# Patient Record
Sex: Male | Born: 1950
Health system: Southern US, Community
[De-identification: ages and names within clinical notes are randomized; demographics above are authoritative.]

## PROBLEM LIST (undated history)

## (undated) DIAGNOSIS — W3400XA Accidental discharge from unspecified firearms or gun, initial encounter: Secondary | ICD-10-CM

## (undated) DIAGNOSIS — J45909 Unspecified asthma, uncomplicated: Secondary | ICD-10-CM

## (undated) DIAGNOSIS — B1920 Unspecified viral hepatitis C without hepatic coma: Secondary | ICD-10-CM

## (undated) DIAGNOSIS — J189 Pneumonia, unspecified organism: Secondary | ICD-10-CM

## (undated) HISTORY — PX: APPENDECTOMY: SHX54

## (undated) HISTORY — PX: OTHER SURGICAL HISTORY: SHX169

## (undated) HISTORY — PX: CHOLECYSTECTOMY: SHX55

---

## 1998-03-20 ENCOUNTER — Emergency Department (HOSPITAL_COMMUNITY): Admission: EM | Admit: 1998-03-20 | Discharge: 1998-03-20 | Payer: Self-pay | Admitting: Internal Medicine

## 1998-03-20 ENCOUNTER — Encounter: Payer: Self-pay | Admitting: Internal Medicine

## 1999-10-20 ENCOUNTER — Encounter: Payer: Self-pay | Admitting: Emergency Medicine

## 1999-10-20 ENCOUNTER — Inpatient Hospital Stay: Admission: EM | Admit: 1999-10-20 | Discharge: 1999-10-26 | Payer: Self-pay | Admitting: Emergency Medicine

## 1999-10-22 ENCOUNTER — Encounter: Payer: Self-pay | Admitting: Internal Medicine

## 2001-02-13 ENCOUNTER — Encounter: Payer: Self-pay | Admitting: Internal Medicine

## 2001-02-13 ENCOUNTER — Ambulatory Visit (HOSPITAL_COMMUNITY): Admission: RE | Admit: 2001-02-13 | Discharge: 2001-02-13 | Payer: Self-pay | Admitting: Internal Medicine

## 2001-03-05 ENCOUNTER — Ambulatory Visit (HOSPITAL_COMMUNITY): Admission: RE | Admit: 2001-03-05 | Discharge: 2001-03-05 | Payer: Self-pay | Admitting: *Deleted

## 2001-03-05 ENCOUNTER — Encounter: Payer: Self-pay | Admitting: *Deleted

## 2001-03-05 ENCOUNTER — Encounter (INDEPENDENT_AMBULATORY_CARE_PROVIDER_SITE_OTHER): Payer: Self-pay | Admitting: Specialist

## 2003-10-04 ENCOUNTER — Ambulatory Visit (HOSPITAL_COMMUNITY): Admission: RE | Admit: 2003-10-04 | Discharge: 2003-10-04 | Payer: Self-pay | Admitting: Gastroenterology

## 2003-11-17 ENCOUNTER — Ambulatory Visit (HOSPITAL_COMMUNITY): Admission: RE | Admit: 2003-11-17 | Discharge: 2003-11-17 | Payer: Self-pay | Admitting: Internal Medicine

## 2004-06-25 ENCOUNTER — Ambulatory Visit: Payer: Self-pay | Admitting: Internal Medicine

## 2005-07-09 ENCOUNTER — Ambulatory Visit (HOSPITAL_COMMUNITY): Admission: RE | Admit: 2005-07-09 | Discharge: 2005-07-09 | Payer: Self-pay | Admitting: Anesthesiology

## 2006-04-09 ENCOUNTER — Observation Stay (HOSPITAL_COMMUNITY): Admission: EM | Admit: 2006-04-09 | Discharge: 2006-04-10 | Payer: Self-pay | Admitting: Emergency Medicine

## 2006-10-16 ENCOUNTER — Ambulatory Visit: Payer: Self-pay | Admitting: Gastroenterology

## 2010-06-24 ENCOUNTER — Encounter: Payer: Self-pay | Admitting: Gastroenterology
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
## 2010-11-13 ENCOUNTER — Ambulatory Visit (HOSPITAL_BASED_OUTPATIENT_CLINIC_OR_DEPARTMENT_OTHER): Payer: 59 | Attending: Internal Medicine

## 2010-11-13 DIAGNOSIS — I4949 Other premature depolarization: Secondary | ICD-10-CM | POA: Insufficient documentation

## 2010-11-13 DIAGNOSIS — G4733 Obstructive sleep apnea (adult) (pediatric): Secondary | ICD-10-CM | POA: Insufficient documentation

## 2010-11-13 DIAGNOSIS — I491 Atrial premature depolarization: Secondary | ICD-10-CM | POA: Insufficient documentation

## 2010-11-17 DIAGNOSIS — G4710 Hypersomnia, unspecified: Secondary | ICD-10-CM

## 2010-11-17 DIAGNOSIS — G4730 Sleep apnea, unspecified: Secondary | ICD-10-CM

## 2010-11-17 NOTE — Procedures (Signed)
NAME:  Malik Fleming, Malik Fleming NO.:  [id number]  MEDICAL RECORD NO.:  [id number]          PATIENT TYPE:  OUT  LOCATION:  SLEEP CENTER                 FACILITY[facility]  PHYSICIAN:  Saleena Tamas D. Maple Hudson, MD, FCCP, FACPDATE OF BIRTH:  1950-12-28  DATE OF STUDY:  11/13/2010                           NOCTURNAL POLYSOMNOGRAM  REFERRING PHYSICIAN:  Barry Dienes. Eloise Harman, M.D.  INDICATIONS FOR STUDY:  Hypersomnia with sleep apnea.  EPWORTH SLEEPINESS SCORE:  18/24, BMI 20.1.  Weight 140 pounds, height 70 inches.  Neck 14 inches.  HOME MEDICATIONS:  Charted and reviewed.  SLEEP ARCHITECTURE:  Total sleep time 373.5 minutes with sleep efficiency 84.9%.  Stage I was 14.2%, stage II 62.8%, stage III absent, REM 13% of total sleep time.  Sleep latency 25 minutes, REM latency 63 minutes, awake after sleep onset 41 minutes, arousal index 56.5.  BEDTIME MEDICATION:  None.  RESPIRATORY DATA:  Apnea/hypopnea index (AHI) 7.2 per hour.  A total of 45 events was scored including 17 obstructive apneas, 6 central apneas, 22 hypopneas.  Events were associated with supine sleep position.  REM AHI 21.1 per hour.  This was a diagnostic and PSG protocol as requested. CPAP titration was not performed.  OXYGEN DATA:  Mild-to-moderately loud snoring with oxygen desaturation to a nadir of 84% and a mean oxygen saturation through the study of 90.4% on room air.  A total of 3.7 minutes was recorded with oxygen saturation less than 88% on room air.  CARDIAC DATA:  Sinus rhythm with occasional PAC and PVC.  MOVEMENT/PARASOMNIA:  No significant movement disturbance.  Bathroom x1.  IMPRESSION/RECOMMENDATIONS: 1. Mild obstructive sleep apnea/hypopnea syndrome, AHI 7.2 per hour.     Mild to moderately loud snoring with oxygen desaturation to a nadir     of 84% and a mean oxygen saturation through the study of 90.4% on     room air. The Respiratory Disturbance Index (RDI) of 21.1/ hr     refects relatively  high number of additional events, too short or     mild to meet standard defining criteria as apnea, but suggesting     additional tendency for airway relaxation to disturb sleep. 2. The study was done as a diagnostic and PSG protocol as requested.     Scores in this range may be well treated     with CPAP if symptoms justify and simpler measures are     insufficient. .  More conservative measures including consideration     of a chin strap or encouragement to sleep off flat or back, may be     considered.     Giulietta Prokop D. Maple Hudson, MD, [facility], FACP Diplomate, Biomedical engineer of Sleep Medicine Electronically Signed    CDY/MEDQ  D:  11/17/2010 08:12:44  T:  11/17/2010 09:51:08  Job:  [id number]

## 2010-11-27 ENCOUNTER — Encounter: Payer: Self-pay | Admitting: *Deleted

## 2010-11-27 ENCOUNTER — Ambulatory Visit (INDEPENDENT_AMBULATORY_CARE_PROVIDER_SITE_OTHER): Payer: 59 | Admitting: Internal Medicine

## 2010-11-27 ENCOUNTER — Encounter: Payer: Self-pay | Admitting: Internal Medicine

## 2010-11-27 VITALS — BP 110/68 | HR 65 | Ht 70.0 in | Wt 148.0 lb

## 2010-11-27 DIAGNOSIS — G4733 Obstructive sleep apnea (adult) (pediatric): Secondary | ICD-10-CM

## 2010-11-27 DIAGNOSIS — Z720 Tobacco use: Secondary | ICD-10-CM

## 2010-11-27 DIAGNOSIS — F17200 Nicotine dependence, unspecified, uncomplicated: Secondary | ICD-10-CM
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
## 2010-11-27 NOTE — Patient Instructions (Addendum)
It is your responsibility to be an alert, safe driver.  Consider appropriate use of caffeine- drink or tablet, occasionally if needed.  Naps can be good, just not while driving.  Some like chin straps for snoring- available at drug store.  Sample Nasonex - 1-2 sprays each nostril, every night at bedtime. This may make breathing through your nose easier  Booklet on sleep apnea for your information. Ask your wife what she notices about your breathing while you are asleep.   Letter for employer: Was seen today for necessary medical care.  May return to unrestricted work and driving.
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
## 2010-11-29 ENCOUNTER — Telehealth: Payer: Self-pay | Admitting: Internal Medicine

## 2010-11-29 NOTE — Telephone Encounter (Signed)
Spoke with patient-states he needs OV notes faxed asap to Dr Jarold Motto at [facility] as his job is at risk. I called and spoke with Lupita Leash at Dr. Norval Gable office and she is aware of the work note and OV notes we have here-faxed incomplete note-Donna stated she would just write a note for pt's job on a Rx pad and fax to his job.    Pt is aware of the above-he knows to contact Dr. Norval Gable office with any questions regarding this matter.

## 2010-12-02 ENCOUNTER — Encounter: Payer: Self-pay | Admitting: Internal Medicine

## 2010-12-02 DIAGNOSIS — Z720 Tobacco use: Secondary | ICD-10-CM | POA: Insufficient documentation

## 2010-12-14 ENCOUNTER — Encounter: Payer: Self-pay | Admitting: Internal Medicine

## 2015-08-31 ENCOUNTER — Encounter (HOSPITAL_COMMUNITY): Payer: Self-pay | Admitting: Emergency Medicine

## 2015-08-31 ENCOUNTER — Emergency Department (HOSPITAL_COMMUNITY): Payer: 59

## 2015-08-31 ENCOUNTER — Emergency Department (HOSPITAL_COMMUNITY)
Admission: EM | Admit: 2015-08-31 | Discharge: 2015-09-01 | Disposition: A | Discharge status: Home / Self Care | Payer: 59 | Attending: Emergency Medicine | Admitting: Emergency Medicine

## 2015-08-31 DIAGNOSIS — Z8619 Personal history of other infectious and parasitic diseases: Secondary | ICD-10-CM | POA: Diagnosis not present

## 2015-08-31 DIAGNOSIS — F17210 Nicotine dependence, cigarettes, uncomplicated: Secondary | ICD-10-CM | POA: Diagnosis not present

## 2015-08-31 DIAGNOSIS — Z9049 Acquired absence of other specified parts of digestive tract: Secondary | ICD-10-CM | POA: Insufficient documentation

## 2015-08-31 DIAGNOSIS — R079 Chest pain, unspecified: Secondary | ICD-10-CM | POA: Diagnosis not present

## 2015-08-31 DIAGNOSIS — Z79899 Other long term (current) drug therapy: Secondary | ICD-10-CM | POA: Insufficient documentation

## 2015-08-31 DIAGNOSIS — J441 Chronic obstructive pulmonary disease with (acute) exacerbation: Secondary | ICD-10-CM | POA: Insufficient documentation

## 2015-08-31 DIAGNOSIS — R0989 Other specified symptoms and signs involving the circulatory and respiratory systems: Secondary | ICD-10-CM | POA: Insufficient documentation

## 2015-08-31 DIAGNOSIS — K5732 Diverticulitis of large intestine without perforation or abscess without bleeding: Secondary | ICD-10-CM | POA: Diagnosis not present

## 2015-08-31 DIAGNOSIS — R109 Unspecified abdominal pain: Secondary | ICD-10-CM | POA: Diagnosis present

## 2015-08-31 DIAGNOSIS — Z87828 Personal history of other (healed) physical injury and trauma: Secondary | ICD-10-CM | POA: Insufficient documentation

## 2015-08-31 DIAGNOSIS — Z79891 Long term (current) use of opiate analgesic: Secondary | ICD-10-CM | POA: Insufficient documentation

## 2015-08-31 HISTORY — DX: Accidental discharge from unspecified firearms or gun, initial encounter: W34.00XA

## 2015-08-31 HISTORY — DX: Unspecified viral hepatitis C without hepatic coma: B19.20

## 2015-08-31 HISTORY — DX: Unspecified asthma, uncomplicated: J45.909

## 2015-08-31 LAB — CBC
HCT: 40.7 % (ref 39.0–52.0)
Hemoglobin: 14.1 g/dL (ref 13.0–17.0)
MCH: 36.1 pg — AB (ref 26.0–34.0)
MCHC: 34.6 g/dL (ref 30.0–36.0)
MCV: 104.1 fL — AB (ref 78.0–100.0)
PLATELETS: 208 10*3/uL (ref 150–400)
RBC: 3.91 MIL/uL — AB (ref 4.22–5.81)
RDW: 12.7 % (ref 11.5–15.5)
WBC: 12.3 10*3/uL — AB (ref 4.0–10.5)

## 2015-08-31 LAB — HEPATIC FUNCTION PANEL
ALBUMIN: 4.1 g/dL (ref 3.5–5.0)
ALK PHOS: 66 U/L (ref 38–126)
ALT: 28 U/L (ref 17–63)
AST: 28 U/L (ref 15–41)
BILIRUBIN DIRECT: 0.2 mg/dL (ref 0.1–0.5)
BILIRUBIN TOTAL: 0.6 mg/dL (ref 0.3–1.2)
Indirect Bilirubin: 0.4 mg/dL (ref 0.3–0.9)
Total Protein: 8.1 g/dL (ref 6.5–8.1)

## 2015-08-31 LAB — BASIC METABOLIC PANEL
Anion gap: 10 (ref 5–15)
BUN: 10 mg/dL (ref 6–20)
CALCIUM: 9.3 mg/dL (ref 8.9–10.3)
CO2: 25 mmol/L (ref 22–32)
CREATININE: 0.89 mg/dL (ref 0.61–1.24)
Chloride: 102 mmol/L (ref 101–111)
Glucose, Bld: 119 mg/dL — ABNORMAL HIGH (ref 65–99)
Potassium: 3.6 mmol/L (ref 3.5–5.1)
SODIUM: 137 mmol/L (ref 135–145)

## 2015-08-31 LAB — LIPASE, BLOOD: LIPASE: 56 U/L — AB (ref 11–51)

## 2015-08-31 LAB — I-STAT TROPONIN, ED: TROPONIN I, POC: 0 ng/mL (ref 0.00–0.08)

## 2015-08-31 MED ORDER — ONDANSETRON 4 MG PO TBDP
ORAL_TABLET | ORAL | Status: AC
  Filled 2015-08-31: qty 2

## 2015-08-31 MED ORDER — IPRATROPIUM-ALBUTEROL 0.5-2.5 (3) MG/3ML IN SOLN
3.0000 mL | Freq: Once | RESPIRATORY_TRACT | Status: AC
  Administered 2015-08-31: 3 mL via RESPIRATORY_TRACT
  Filled 2015-08-31: qty 3

## 2015-08-31 MED ORDER — ONDANSETRON 4 MG PO TBDP
8.0000 mg | ORAL_TABLET | Freq: Once | ORAL | Status: AC
  Administered 2015-08-31: 8 mg via ORAL

## 2015-08-31 MED ORDER — MORPHINE SULFATE (PF) 4 MG/ML IV SOLN
4.0000 mg | Freq: Once | INTRAVENOUS | Status: AC
  Administered 2015-09-01: 4 mg via INTRAVENOUS
  Filled 2015-08-31: qty 1

## 2015-08-31 MED ORDER — SODIUM CHLORIDE 0.9 % IV BOLUS (SEPSIS)
500.0000 mL | Freq: Once | INTRAVENOUS | Status: AC
  Administered 2015-09-01: 500 mL via INTRAVENOUS

## 2015-08-31 MED ORDER — ONDANSETRON HCL 4 MG/2ML IJ SOLN
4.0000 mg | Freq: Once | INTRAMUSCULAR | Status: AC
  Administered 2015-09-01: 4 mg via INTRAVENOUS
  Filled 2015-08-31: qty 2
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
## 2015-08-31 NOTE — ED Notes (Signed)
Dr. Pollina at bedside at this time. 

## 2015-08-31 NOTE — ED Notes (Signed)
Pt. reports intermittent central chest pain with SOB , dry cough  and emesis onset this evening .

## 2015-09-01 ENCOUNTER — Emergency Department (HOSPITAL_COMMUNITY): Payer: 59

## 2015-09-01 ENCOUNTER — Encounter (HOSPITAL_COMMUNITY): Payer: Self-pay | Admitting: Radiology

## 2015-09-01 LAB — BRAIN NATRIURETIC PEPTIDE: B Natriuretic Peptide: 21.8 pg/mL (ref 0.0–100.0)

## 2015-09-01 MED ORDER — IOPAMIDOL (ISOVUE-300) INJECTION 61%
INTRAVENOUS | Status: AC
  Filled 2015-09-01: qty 100

## 2015-09-01 MED ORDER — IOHEXOL 350 MG/ML SOLN
100.0000 mL | Freq: Once | INTRAVENOUS | Status: AC | PRN
  Administered 2015-09-01: 100 mL via INTRAVENOUS

## 2015-09-01 MED ORDER — HYDROMORPHONE HCL 1 MG/ML IJ SOLN
1.0000 mg | Freq: Once | INTRAMUSCULAR | Status: AC
  Administered 2015-09-01: 1 mg via INTRAVENOUS
  Filled 2015-09-01: qty 1

## 2015-09-01 MED ORDER — METRONIDAZOLE 500 MG PO TABS: 500.0000 mg | ORAL_TABLET | Freq: Three times a day (TID) | ORAL | Status: DC

## 2015-09-01 MED ORDER — LEVOFLOXACIN 500 MG PO TABS: 500.0000 mg | ORAL_TABLET | Freq: Every day | ORAL | Status: DC

## 2015-09-01 NOTE — ED Notes (Signed)
Patient transported to CT 

## 2015-09-01 NOTE — Discharge Instructions (Signed)
Diverticulitis  Diverticulitis is when small pockets that have formed in your colon (large intestine) become infected or swollen.  HOME CARE  · Follow your doctor's instructions.  · Follow a special diet if told by your doctor.  · When you feel better, your doctor may tell you to change your diet. You may be told to eat a lot of fiber. Fruits and vegetables are good sources of fiber. Fiber makes it easier to poop (have bowel movements).  · Take supplements or probiotics as told by your doctor.  · Only take medicines as told by your doctor.  · Keep all follow-up visits with your doctor.  GET HELP IF:  · Your pain does not get better.  · You have a hard time eating food.  · You are not pooping like normal.  GET HELP RIGHT AWAY IF:  · Your pain gets worse.  · Your problems do not get better.  · Your problems suddenly get worse.  · You have a fever.  · You keep throwing up (vomiting).  · You have bloody or black, tarry poop (stool).  MAKE SURE YOU:   · Understand these instructions.  · Will watch your condition.  · Will get help right away if you are not doing well or get worse.     This information is not intended to replace advice given to you by your health care provider. Make sure you discuss any questions you have with your health care provider.     Document Released: 11/06/2007 Document Revised: 05/25/2013 Document Reviewed: 04/14/2013  Elsevier Interactive Patient Education ©2016 [employer]

## 2017-02-04 ENCOUNTER — Emergency Department (HOSPITAL_COMMUNITY)
Admission: EM | Admit: 2017-02-04 | Discharge: 2017-02-05 | Disposition: A | Discharge status: Home / Self Care | Payer: 59 | Attending: Emergency Medicine | Admitting: Emergency Medicine

## 2017-02-04 ENCOUNTER — Emergency Department (HOSPITAL_COMMUNITY): Payer: 59

## 2017-02-04 ENCOUNTER — Encounter (HOSPITAL_COMMUNITY): Payer: Self-pay

## 2017-02-04 DIAGNOSIS — R112 Nausea with vomiting, unspecified: Secondary | ICD-10-CM | POA: Insufficient documentation

## 2017-02-04 DIAGNOSIS — R079 Chest pain, unspecified: Secondary | ICD-10-CM | POA: Diagnosis not present

## 2017-02-04 DIAGNOSIS — R0602 Shortness of breath: Secondary | ICD-10-CM | POA: Insufficient documentation

## 2017-02-04 DIAGNOSIS — R0789 Other chest pain: Secondary | ICD-10-CM | POA: Diagnosis not present

## 2017-02-04 DIAGNOSIS — K4091 Unilateral inguinal hernia, without obstruction or gangrene, recurrent: Secondary | ICD-10-CM | POA: Diagnosis not present

## 2017-02-04 DIAGNOSIS — F17210 Nicotine dependence, cigarettes, uncomplicated: Secondary | ICD-10-CM | POA: Insufficient documentation

## 2017-02-04 DIAGNOSIS — R1030 Lower abdominal pain, unspecified: Secondary | ICD-10-CM | POA: Insufficient documentation

## 2017-02-04 DIAGNOSIS — R1110 Vomiting, unspecified: Secondary | ICD-10-CM | POA: Diagnosis present

## 2017-02-04 DIAGNOSIS — Z79899 Other long term (current) drug therapy: Secondary | ICD-10-CM | POA: Diagnosis not present

## 2017-02-04 DIAGNOSIS — J45909 Unspecified asthma, uncomplicated: Secondary | ICD-10-CM | POA: Diagnosis not present

## 2017-02-04 LAB — COMPREHENSIVE METABOLIC PANEL
ALBUMIN: 4.2 g/dL (ref 3.5–5.0)
ALT: 24 U/L (ref 17–63)
AST: 31 U/L (ref 15–41)
Alkaline Phosphatase: 77 U/L (ref 38–126)
Anion gap: 9 (ref 5–15)
BUN: 10 mg/dL (ref 6–20)
CHLORIDE: 101 mmol/L (ref 101–111)
CO2: 26 mmol/L (ref 22–32)
Calcium: 9.1 mg/dL (ref 8.9–10.3)
Creatinine, Ser: 0.69 mg/dL (ref 0.61–1.24)
GFR calc Af Amer: >60 mL/min (ref >60)
GFR calc non Af Amer: >60 mL/min (ref >60)
GLUCOSE: 143 mg/dL — AB (ref 65–99)
POTASSIUM: 3.9 mmol/L (ref 3.5–5.1)
Sodium: 136 mmol/L (ref 135–145)
Total Bilirubin: 0.7 mg/dL (ref 0.3–1.2)
Total Protein: 7.7 g/dL (ref 6.5–8.1)

## 2017-02-04 LAB — CBC
HCT: 43.7 % (ref 39.0–52.0)
Hemoglobin: 15.2 g/dL (ref 13.0–17.0)
MCH: 35.8 pg — AB (ref 26.0–34.0)
MCHC: 34.8 g/dL (ref 30.0–36.0)
MCV: 103.1 fL — ABNORMAL HIGH (ref 78.0–100.0)
Platelets: UNDETERMINED 10*3/uL (ref 150–400)
RBC: 4.24 MIL/uL (ref 4.22–5.81)
RDW: 13.1 % (ref 11.5–15.5)
WBC: 9.4 10*3/uL (ref 4.0–10.5)

## 2017-02-04 LAB — I-STAT CG4 LACTIC ACID, ED: LACTIC ACID, VENOUS: 1.45 mmol/L (ref 0.5–1.9)

## 2017-02-04 LAB — I-STAT TROPONIN, ED
TROPONIN I, POC: 0 ng/mL (ref 0.00–0.08)
Troponin i, poc: 0 ng/mL (ref 0.00–0.08)

## 2017-02-04 MED ORDER — FENTANYL CITRATE (PF) 100 MCG/2ML IJ SOLN
50.0000 ug | Freq: Once | INTRAMUSCULAR | Status: AC
  Administered 2017-02-04: 50 ug via INTRAVENOUS
  Filled 2017-02-04: qty 2

## 2017-02-04 MED ORDER — ONDANSETRON HCL 4 MG PO TABS: 4.0000 mg | ORAL_TABLET | Freq: Four times a day (QID) | ORAL | 0 refills | Status: DC

## 2017-02-04 MED ORDER — SODIUM CHLORIDE 0.9 % IV BOLUS (SEPSIS)
1000.0000 mL | Freq: Once | INTRAVENOUS | Status: AC
  Administered 2017-02-04: 1000 mL via INTRAVENOUS

## 2017-02-04 MED ORDER — IOPAMIDOL (ISOVUE-300) INJECTION 61%
INTRAVENOUS | Status: AC
  Administered 2017-02-04: 100 mL via INTRAVENOUS
  Filled 2017-02-04: qty 100

## 2017-02-04 MED ORDER — MORPHINE SULFATE (PF) 4 MG/ML IV SOLN
4.0000 mg | Freq: Once | INTRAVENOUS | Status: AC
  Administered 2017-02-04: 4 mg via INTRAVENOUS
  Filled 2017-02-04: qty 1

## 2017-02-04 MED ORDER — IPRATROPIUM-ALBUTEROL 0.5-2.5 (3) MG/3ML IN SOLN
3.0000 mL | Freq: Once | RESPIRATORY_TRACT | Status: AC
  Administered 2017-02-04: 3 mL via RESPIRATORY_TRACT
  Filled 2017-02-04: qty 3

## 2017-02-04 MED ORDER — IOPAMIDOL (ISOVUE-300) INJECTION 61%: 100.0000 mL | Freq: Once | INTRAVENOUS | Status: DC | PRN

## 2017-02-04 MED ORDER — ONDANSETRON HCL 4 MG/2ML IJ SOLN
4.0000 mg | Freq: Once | INTRAMUSCULAR | Status: AC
  Administered 2017-02-04: 4 mg via INTRAVENOUS
  Filled 2017-02-04: qty 2

## 2017-02-04 MED ORDER — OXYCODONE HCL 5 MG PO TABS: 5.0000 mg | ORAL_TABLET | Freq: Once | ORAL | Status: DC

## 2017-02-04 MED ORDER — FENTANYL CITRATE (PF) 100 MCG/2ML IJ SOLN
50.0000 ug | Freq: Once | INTRAMUSCULAR | Status: AC
  Administered 2017-02-04: 50 ug via INTRAVENOUS

## 2017-02-04 NOTE — ED Notes (Signed)
Patient states he just feels miserable

## 2017-02-04 NOTE — ED Notes (Signed)
Dr. Tyrone Nine at bedside-patient placed in modified trendelenburg to facilitate right lower quad hernia to reduce-patient complaining 10/10 right lower quad/groin pain. Medicated with Fentanyl 100 mcg IV per VO Dr. Tyrone Nine. Will continue to monitor

## 2017-02-04 NOTE — Discharge Instructions (Signed)
Follow-up with her primary care doctor next 24-48 hours for further evaluation.  Take the Zofran as directed for nausea/vomiting.  Patient taking ibuprofen or irregular pain medications for pain.  Make sure you're just drinking plenty of fluids and staying hydrated.  Return the emergency Department for any fever, worsening chest pain, abdominal pain, difficulty breathing, redness/swelling of the hernia or any other worsening or concerning symptoms.
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
## 2017-02-04 NOTE — ED Notes (Signed)
Transported to CT 

## 2017-02-04 NOTE — ED Notes (Signed)
Patient states he has COPD and needs a breathing treatment-RT paged

## 2017-02-04 NOTE — ED Triage Notes (Signed)
Patient c/o mid chest pain since 1230 today. Patient states increased SOB and has COPD. Patient also c/o vomiting since early this AM. Patient also has a right hernia with increased pain and swelling. Patient denies any testicular swelling.

## 2017-02-04 NOTE — ED Notes (Signed)
Re-evaluated by PA-patient reports no change in chest pain or right lower groin pain

## 2017-02-04 NOTE — ED Notes (Signed)
Patient wants to stay in clothes instead of changing into a gown.

## 2017-02-05 NOTE — ED Provider Notes (Signed)
Hernia reduction Date/Time: 02/05/2017 12:36 PM Performed by: Tyrone Nine Tresia Revolorio Authorized by: Deno Etienne  Consent: Verbal consent obtained. Consent given by: patient Patient understanding: patient states understanding of the procedure being performed Patient consent: the patient's understanding of the procedure matches consent given Time out: Immediately prior to procedure a "time out" was called to verify the correct patient, procedure, equipment, support staff and site/side marked as required. Local anesthesia used: no  Anesthesia: Local anesthesia used: no  Sedation: Patient sedated: no Patient tolerance: Patient tolerated the procedure well with no immediate complications    Medical screening examination/treatment/procedure(s) were conducted as a shared visit with non-physician practitioner(s) and myself.  I personally evaluated the patient during the encounter.   EKG Interpretation  Date/Time:  Tuesday February 04 2017 16:44:16 EDT Ventricular Rate:  65 PR Interval:    QRS Duration: 96 QT Interval:  427 QTC Calculation: 444 R Axis:   79 Text Interpretation:  Sinus rhythm Borderline short PR interval No significant change since last tracing Confirmed by Deno Etienne [phone]) on 02/04/2017 6:30:17 PM Also confirmed by Deno Etienne [phone]), editor Drema Pry [phone])  on 02/05/2017 7:34:25 AM        See the written copy of this report in the patient's paper medical record.  These results did not interface directly into the electronic medical record and are summarized here.  66 yo M with nausea and vomiting.  Afterwhich started having abdominal pain at the location of a direct inguinal hernia.  Reduced at bedside, no noted erythema, minimal difficulty.  Clear lungs, no other noted  Abdominal pain.  Persistent pain post reduction so CT obtained.  Negative for acute pathology.  Improved clinically.  Given surgical follow up.    Deno Etienne, DO 02/05/17 1238

## 2017-04-08 ENCOUNTER — Emergency Department (HOSPITAL_COMMUNITY): Payer: 59

## 2017-04-08 ENCOUNTER — Encounter (HOSPITAL_COMMUNITY): Payer: Self-pay | Admitting: Emergency Medicine

## 2017-04-08 ENCOUNTER — Observation Stay (HOSPITAL_COMMUNITY)
Admission: EM | Admit: 2017-04-08 | Discharge: 2017-04-15 | DRG: 392 | Disposition: A | Discharge status: Home / Self Care | Payer: 59 | Attending: Family Medicine | Admitting: Family Medicine

## 2017-04-08 DIAGNOSIS — R402412 Glasgow coma scale score 13-15, at arrival to emergency department: Secondary | ICD-10-CM | POA: Diagnosis not present

## 2017-04-08 DIAGNOSIS — F17210 Nicotine dependence, cigarettes, uncomplicated: Secondary | ICD-10-CM | POA: Diagnosis not present

## 2017-04-08 DIAGNOSIS — Z7951 Long term (current) use of inhaled steroids: Secondary | ICD-10-CM | POA: Diagnosis not present

## 2017-04-08 DIAGNOSIS — R079 Chest pain, unspecified: Secondary | ICD-10-CM | POA: Diagnosis present

## 2017-04-08 DIAGNOSIS — Z880 Allergy status to penicillin: Secondary | ICD-10-CM

## 2017-04-08 DIAGNOSIS — K4090 Unilateral inguinal hernia, without obstruction or gangrene, not specified as recurrent: Secondary | ICD-10-CM

## 2017-04-08 DIAGNOSIS — K5732 Diverticulitis of large intestine without perforation or abscess without bleeding: Secondary | ICD-10-CM | POA: Diagnosis not present

## 2017-04-08 DIAGNOSIS — J449 Chronic obstructive pulmonary disease, unspecified: Secondary | ICD-10-CM | POA: Diagnosis not present

## 2017-04-08 DIAGNOSIS — Z79899 Other long term (current) drug therapy: Secondary | ICD-10-CM

## 2017-04-08 DIAGNOSIS — Z886 Allergy status to analgesic agent status: Secondary | ICD-10-CM

## 2017-04-08 DIAGNOSIS — K5792 Diverticulitis of intestine, part unspecified, without perforation or abscess without bleeding: Secondary | ICD-10-CM | POA: Diagnosis present

## 2017-04-08 DIAGNOSIS — E876 Hypokalemia: Secondary | ICD-10-CM | POA: Diagnosis not present

## 2017-04-08 DIAGNOSIS — Z720 Tobacco use: Secondary | ICD-10-CM | POA: Diagnosis present

## 2017-04-08 DIAGNOSIS — Z765 Malingerer [conscious simulation]: Secondary | ICD-10-CM

## 2017-04-08 DIAGNOSIS — R112 Nausea with vomiting, unspecified: Secondary | ICD-10-CM | POA: Diagnosis present

## 2017-04-08 LAB — CBC
HCT: 43.6 % (ref 39.0–52.0)
Hemoglobin: 15.3 g/dL (ref 13.0–17.0)
MCH: 35.4 pg — ABNORMAL HIGH (ref 26.0–34.0)
MCHC: 35.1 g/dL (ref 30.0–36.0)
MCV: 100.9 fL — ABNORMAL HIGH (ref 78.0–100.0)
Platelets: 209 10*3/uL (ref 150–400)
RBC: 4.32 MIL/uL (ref 4.22–5.81)
RDW: 13.1 % (ref 11.5–15.5)
WBC: 8.6 10*3/uL (ref 4.0–10.5)

## 2017-04-08 LAB — BASIC METABOLIC PANEL
Anion gap: 14 (ref 5–15)
BUN: <5 mg/dL — ABNORMAL LOW (ref 6–20)
CO2: 22 mmol/L (ref 22–32)
Calcium: 8.8 mg/dL — ABNORMAL LOW (ref 8.9–10.3)
Chloride: 102 mmol/L (ref 101–111)
Creatinine, Ser: 0.67 mg/dL (ref 0.61–1.24)
GFR calc Af Amer: >60 mL/min (ref >60)
GFR calc non Af Amer: >60 mL/min (ref >60)
Glucose, Bld: 130 mg/dL — ABNORMAL HIGH (ref 65–99)
Potassium: 3 mmol/L — ABNORMAL LOW (ref 3.5–5.1)
Sodium: 138 mmol/L (ref 135–145)

## 2017-04-08 LAB — I-STAT TROPONIN, ED: TROPONIN I, POC: 0 ng/mL (ref 0.00–0.08)

## 2017-04-08 LAB — HEPATIC FUNCTION PANEL
ALT: 23 U/L (ref 17–63)
AST: 33 U/L (ref 15–41)
Albumin: 3.9 g/dL (ref 3.5–5.0)
Alkaline Phosphatase: 79 U/L (ref 38–126)
BILIRUBIN DIRECT: 0.3 mg/dL (ref 0.1–0.5)
BILIRUBIN TOTAL: 0.9 mg/dL (ref 0.3–1.2)
Indirect Bilirubin: 0.6 mg/dL (ref 0.3–0.9)
Total Protein: 7.2 g/dL (ref 6.5–8.1)

## 2017-04-08 LAB — LIPASE, BLOOD: Lipase: 28 U/L (ref 11–51)

## 2017-04-08 MED ORDER — MORPHINE SULFATE (PF) 4 MG/ML IV SOLN
4.0000 mg | Freq: Once | INTRAVENOUS | Status: AC
  Administered 2017-04-08: 4 mg via INTRAVENOUS
  Filled 2017-04-08: qty 1

## 2017-04-08 MED ORDER — HYDROMORPHONE HCL 1 MG/ML IJ SOLN
1.0000 mg | Freq: Once | INTRAMUSCULAR | Status: AC
  Administered 2017-04-09: 1 mg via INTRAVENOUS
  Filled 2017-04-08: qty 1

## 2017-04-08 MED ORDER — METRONIDAZOLE IN NACL 5-0.79 MG/ML-% IV SOLN
500.0000 mg | Freq: Once | INTRAVENOUS | Status: AC
  Administered 2017-04-09: 500 mg via INTRAVENOUS
  Filled 2017-04-08: qty 100

## 2017-04-08 MED ORDER — IOPAMIDOL (ISOVUE-300) INJECTION 61%
INTRAVENOUS | Status: AC
  Administered 2017-04-08: 100 mL
  Filled 2017-04-08: qty 100

## 2017-04-08 MED ORDER — METOCLOPRAMIDE HCL 5 MG/ML IJ SOLN
10.0000 mg | Freq: Once | INTRAMUSCULAR | Status: AC
  Administered 2017-04-08: 10 mg via INTRAVENOUS
  Filled 2017-04-08: qty 2

## 2017-04-08 MED ORDER — CIPROFLOXACIN IN D5W 400 MG/200ML IV SOLN
400.0000 mg | Freq: Once | INTRAVENOUS | Status: AC
  Administered 2017-04-09: 400 mg via INTRAVENOUS
  Filled 2017-04-08: qty 200

## 2017-04-08 MED ORDER — POTASSIUM CHLORIDE 10 MEQ/100ML IV SOLN
10.0000 meq | Freq: Once | INTRAVENOUS | Status: AC
  Administered 2017-04-08: 10 meq via INTRAVENOUS
  Filled 2017-04-08: qty 100

## 2017-04-08 MED ORDER — SODIUM CHLORIDE 0.9 % IV BOLUS (SEPSIS)
1000.0000 mL | Freq: Once | INTRAVENOUS | Status: AC
  Administered 2017-04-08: 1000 mL via INTRAVENOUS

## 2017-04-08 NOTE — ED Notes (Signed)
Pt refuses to put gown on or change clothes.

## 2017-04-08 NOTE — ED Notes (Signed)
Patient transported to X-ray 

## 2017-04-08 NOTE — ED Notes (Signed)
Pt c/o burning at IV site and up forearm. Fluid bolus hung to dilute potassium. Pt also requesting water, which was provided, and "something to help me sleep."

## 2017-04-08 NOTE — ED Notes (Signed)
ED Provider at bedside. 
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
## 2017-04-08 NOTE — ED Notes (Signed)
Pt up to restroom. Upon return, pt will not keep b/p cuff on.  Does not want to be hooked up to "that stuff". Explained it was important to monitor him.

## 2017-04-08 NOTE — ED Notes (Addendum)
Pt non-stop cursing in room towards this RN.

## 2017-04-08 NOTE — ED Notes (Signed)
Pt returned from xray

## 2017-04-08 NOTE — ED Notes (Signed)
Patient transported to CT 

## 2017-04-09 ENCOUNTER — Encounter (HOSPITAL_COMMUNITY): Payer: Self-pay | Admitting: Internal Medicine

## 2017-04-09 DIAGNOSIS — K5732 Diverticulitis of large intestine without perforation or abscess without bleeding: Secondary | ICD-10-CM | POA: Diagnosis not present

## 2017-04-09 DIAGNOSIS — R197 Diarrhea, unspecified: Secondary | ICD-10-CM

## 2017-04-09 DIAGNOSIS — J449 Chronic obstructive pulmonary disease, unspecified: Secondary | ICD-10-CM | POA: Diagnosis not present

## 2017-04-09 DIAGNOSIS — K5792 Diverticulitis of intestine, part unspecified, without perforation or abscess without bleeding: Secondary | ICD-10-CM | POA: Diagnosis present

## 2017-04-09 DIAGNOSIS — K4090 Unilateral inguinal hernia, without obstruction or gangrene, not specified as recurrent: Secondary | ICD-10-CM

## 2017-04-09 DIAGNOSIS — R112 Nausea with vomiting, unspecified: Secondary | ICD-10-CM

## 2017-04-09 DIAGNOSIS — Z720 Tobacco use: Secondary | ICD-10-CM | POA: Diagnosis not present

## 2017-04-09 DIAGNOSIS — E876 Hypokalemia: Secondary | ICD-10-CM | POA: Diagnosis present

## 2017-04-09 LAB — CBC
HCT: 38.3 % — ABNORMAL LOW (ref 39.0–52.0)
HEMOGLOBIN: 13.1 g/dL (ref 13.0–17.0)
MCH: 35.1 pg — AB (ref 26.0–34.0)
MCHC: 34.2 g/dL (ref 30.0–36.0)
MCV: 102.7 fL — ABNORMAL HIGH (ref 78.0–100.0)
Platelets: 185 10*3/uL (ref 150–400)
RBC: 3.73 MIL/uL — ABNORMAL LOW (ref 4.22–5.81)
RDW: 13.6 % (ref 11.5–15.5)
WBC: 8.1 10*3/uL (ref 4.0–10.5)

## 2017-04-09 LAB — BASIC METABOLIC PANEL
Anion gap: 9 (ref 5–15)
CALCIUM: 8 mg/dL — AB (ref 8.9–10.3)
CHLORIDE: 104 mmol/L (ref 101–111)
CO2: 23 mmol/L (ref 22–32)
CREATININE: 0.69 mg/dL (ref 0.61–1.24)
GFR calc non Af Amer: >60 mL/min (ref >60)
Glucose, Bld: 100 mg/dL — ABNORMAL HIGH (ref 65–99)
Potassium: 3.4 mmol/L — ABNORMAL LOW (ref 3.5–5.1)
SODIUM: 136 mmol/L (ref 135–145)

## 2017-04-09 LAB — TROPONIN I
Troponin I: <0.03 ng/mL (ref <0.03)
Troponin I: <0.03 ng/mL (ref <0.03)

## 2017-04-09 MED ORDER — ACETAMINOPHEN 325 MG PO TABS
650.0000 mg | ORAL_TABLET | Freq: Four times a day (QID) | ORAL | Status: DC | PRN
  Administered 2017-04-09: 650 mg via ORAL
  Filled 2017-04-09: qty 2

## 2017-04-09 MED ORDER — POTASSIUM CHLORIDE 10 MEQ/100ML IV SOLN
10.0000 meq | INTRAVENOUS | Status: AC
  Administered 2017-04-09 (×3): 10 meq via INTRAVENOUS
  Filled 2017-04-09 (×4): qty 100

## 2017-04-09 MED ORDER — LORAZEPAM 1 MG PO TABS
1.0000 mg | ORAL_TABLET | Freq: Every evening | ORAL | Status: DC | PRN
  Administered 2017-04-09: 1 mg via ORAL
  Filled 2017-04-09: qty 1

## 2017-04-09 MED ORDER — OXYCODONE HCL 5 MG PO TABS
5.0000 mg | ORAL_TABLET | Freq: Every day | ORAL | Status: DC
  Administered 2017-04-09: 5 mg via ORAL
  Filled 2017-04-09: qty 1

## 2017-04-09 MED ORDER — OXYCODONE HCL 5 MG PO TABS
10.0000 mg | ORAL_TABLET | ORAL | Status: DC
  Administered 2017-04-09 – 2017-04-12 (×4): 10 mg via ORAL
  Filled 2017-04-09 (×4): qty 2

## 2017-04-09 MED ORDER — OXYCODONE HCL 5 MG PO TABS
5.0000 mg | ORAL_TABLET | ORAL | Status: DC | PRN
  Administered 2017-04-09 – 2017-04-13 (×12): 5 mg via ORAL
  Filled 2017-04-09 (×12): qty 1

## 2017-04-09 MED ORDER — ACETAMINOPHEN 650 MG RE SUPP: 650.0000 mg | Freq: Four times a day (QID) | RECTAL | Status: DC | PRN

## 2017-04-09 MED ORDER — ALBUTEROL SULFATE (2.5 MG/3ML) 0.083% IN NEBU
2.5000 mg | INHALATION_SOLUTION | RESPIRATORY_TRACT | Status: DC | PRN
Start: 2017-04-09 — End: 2017-04-15

## 2017-04-09 MED ORDER — UMECLIDINIUM-VILANTEROL 62.5-25 MCG/INH IN AEPB
1.0000 | INHALATION_SPRAY | Freq: Every day | RESPIRATORY_TRACT | Status: DC
  Administered 2017-04-09 – 2017-04-15 (×6): 1 via RESPIRATORY_TRACT
  Filled 2017-04-09 (×2): qty 14

## 2017-04-09 MED ORDER — SODIUM CHLORIDE 0.9 % IV SOLN
INTRAVENOUS | Status: DC
  Administered 2017-04-09: 02:00:00 via INTRAVENOUS

## 2017-04-09 MED ORDER — HYDROMORPHONE HCL 1 MG/ML IJ SOLN: 1.0000 mg | INTRAMUSCULAR | Status: DC | PRN

## 2017-04-09 MED ORDER — MORPHINE SULFATE (PF) 4 MG/ML IV SOLN
4.0000 mg | Freq: Once | INTRAVENOUS | Status: AC
  Administered 2017-04-09: 4 mg via INTRAVENOUS
  Filled 2017-04-09: qty 1

## 2017-04-09 MED ORDER — METRONIDAZOLE IN NACL 5-0.79 MG/ML-% IV SOLN
500.0000 mg | Freq: Three times a day (TID) | INTRAVENOUS | Status: DC
  Administered 2017-04-09 – 2017-04-15 (×19): 500 mg via INTRAVENOUS
  Filled 2017-04-09 (×19): qty 100

## 2017-04-09 MED ORDER — ONDANSETRON HCL 4 MG PO TABS: 4.0000 mg | ORAL_TABLET | Freq: Four times a day (QID) | ORAL | Status: DC | PRN

## 2017-04-09 MED ORDER — POTASSIUM CHLORIDE IN NACL 20-0.9 MEQ/L-% IV SOLN
INTRAVENOUS | Status: DC
  Administered 2017-04-09 – 2017-04-11 (×4): via INTRAVENOUS
  Filled 2017-04-09 (×6): qty 1000

## 2017-04-09 MED ORDER — HYDROMORPHONE HCL 1 MG/ML IJ SOLN
0.5000 mg | INTRAMUSCULAR | Status: DC | PRN
  Administered 2017-04-09 (×2): 0.5 mg via INTRAVENOUS
  Filled 2017-04-09 (×2): qty 1

## 2017-04-09 MED ORDER — ONDANSETRON HCL 4 MG/2ML IJ SOLN
4.0000 mg | Freq: Four times a day (QID) | INTRAMUSCULAR | Status: DC | PRN
  Administered 2017-04-11 – 2017-04-13 (×2): 4 mg via INTRAVENOUS
  Filled 2017-04-09 (×2): qty 2

## 2017-04-09 MED ORDER — PANTOPRAZOLE SODIUM 40 MG IV SOLR
40.0000 mg | Freq: Two times a day (BID) | INTRAVENOUS | Status: DC
  Filled 2017-04-09 (×2): qty 40

## 2017-04-09 MED ORDER — ZOLPIDEM TARTRATE 5 MG PO TABS
5.0000 mg | ORAL_TABLET | Freq: Once | ORAL | Status: AC
  Administered 2017-04-09: 5 mg via ORAL
  Filled 2017-04-09: qty 1

## 2017-04-09 MED ORDER — ENOXAPARIN SODIUM 40 MG/0.4ML ~~LOC~~ SOLN
40.0000 mg | SUBCUTANEOUS | Status: DC
  Administered 2017-04-09 – 2017-04-14 (×6): 40 mg via SUBCUTANEOUS
  Filled 2017-04-09 (×7): qty 0.4

## 2017-04-09 MED ORDER — CIPROFLOXACIN IN D5W 400 MG/200ML IV SOLN
400.0000 mg | Freq: Two times a day (BID) | INTRAVENOUS | Status: DC
  Administered 2017-04-09 – 2017-04-15 (×13): 400 mg via INTRAVENOUS
  Filled 2017-04-09 (×13): qty 200

## 2017-04-09 MED ORDER — OXYCODONE HCL 5 MG PO TABS: 5.0000 mg | ORAL_TABLET | Freq: Every day | ORAL | Status: DC

## 2017-04-09 NOTE — Progress Notes (Signed)
Pt BP 180/85, confirmed with manual. HR 52, pt c/o abdominal pain 9/10 not controlled with tylenol and sweating. Temp 98.2, RR 18. MD notified, will continue to monitor.

## 2017-04-09 NOTE — Progress Notes (Signed)
Patient admitted after midnight, please see H&P.  Plan to f/up outpatient for known non-incarcerated hernia.  Will continue to treat presumed diverticulitis.  Once recovered, will need GI follow up for colonoscopy--- last one prob 10 years ago.  Eulogio Bear DO
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
## 2017-04-09 NOTE — Progress Notes (Signed)
Pt admitted from ED per stretcher accompanied by a nurse tech, on arrival to the floor pt was fully alert and oriented vital signs stable ID bracelet varified with pt, refused skin assessment and iv protonix per pt has not been able to sleep for two days and therefore asked me to paged on call Dr Hal Hope  for sleeping pill, gave me verbal order for ambiem 5mg , all other due meds are given hooked to tele and CCMD notified, low fall risk independent with no assistive device, will continue to monitor pt

## 2017-04-09 NOTE — ED Notes (Signed)
Pt refuses to keep b/p cuff on

## 2017-04-09 NOTE — ED Notes (Signed)
Patient denies pain and is resting comfortably.  
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
## 2017-04-10 DIAGNOSIS — Z720 Tobacco use: Secondary | ICD-10-CM | POA: Diagnosis not present

## 2017-04-10 DIAGNOSIS — E876 Hypokalemia: Secondary | ICD-10-CM | POA: Diagnosis not present

## 2017-04-10 DIAGNOSIS — K4090 Unilateral inguinal hernia, without obstruction or gangrene, not specified as recurrent: Secondary | ICD-10-CM | POA: Diagnosis not present

## 2017-04-10 DIAGNOSIS — K5792 Diverticulitis of intestine, part unspecified, without perforation or abscess without bleeding: Secondary | ICD-10-CM | POA: Diagnosis not present

## 2017-04-10 LAB — URINALYSIS, ROUTINE W REFLEX MICROSCOPIC
BILIRUBIN URINE: NEGATIVE
Glucose, UA: NEGATIVE mg/dL
Hgb urine dipstick: NEGATIVE
KETONES UR: NEGATIVE mg/dL
LEUKOCYTES UA: NEGATIVE
NITRITE: NEGATIVE
PROTEIN: NEGATIVE mg/dL
Specific Gravity, Urine: 1.005 (ref 1.005–1.030)
pH: 7 (ref 5.0–8.0)

## 2017-04-10 LAB — BASIC METABOLIC PANEL
ANION GAP: 9 (ref 5–15)
CHLORIDE: 105 mmol/L (ref 101–111)
CO2: 22 mmol/L (ref 22–32)
Calcium: 8.8 mg/dL — ABNORMAL LOW (ref 8.9–10.3)
Creatinine, Ser: 0.71 mg/dL (ref 0.61–1.24)
Glucose, Bld: 119 mg/dL — ABNORMAL HIGH (ref 65–99)
POTASSIUM: 3.7 mmol/L (ref 3.5–5.1)
SODIUM: 136 mmol/L (ref 135–145)

## 2017-04-10 LAB — CBC
HCT: 41.6 % (ref 39.0–52.0)
HEMOGLOBIN: 14.2 g/dL (ref 13.0–17.0)
MCH: 35.4 pg — ABNORMAL HIGH (ref 26.0–34.0)
MCHC: 34.1 g/dL (ref 30.0–36.0)
MCV: 103.7 fL — ABNORMAL HIGH (ref 78.0–100.0)
PLATELETS: 180 10*3/uL (ref 150–400)
RBC: 4.01 MIL/uL — AB (ref 4.22–5.81)
RDW: 13.5 % (ref 11.5–15.5)
WBC: 4.9 10*3/uL (ref 4.0–10.5)

## 2017-04-10 MED ORDER — TRAZODONE HCL 50 MG PO TABS
50.0000 mg | ORAL_TABLET | Freq: Every evening | ORAL | Status: DC | PRN
  Administered 2017-04-10 – 2017-04-12 (×3): 50 mg via ORAL
  Filled 2017-04-10 (×3): qty 1

## 2017-04-10 MED ORDER — HYDROMORPHONE HCL 1 MG/ML IJ SOLN
0.5000 mg | INTRAMUSCULAR | Status: DC | PRN
  Administered 2017-04-10 – 2017-04-12 (×13): 0.5 mg via INTRAVENOUS
  Filled 2017-04-10 (×13): qty 1

## 2017-04-10 NOTE — Progress Notes (Signed)
PROGRESS NOTE    Malik Fleming  WPY:[id number] DOB: 1950-11-22 DOA: 04/08/2017 PCP: Leanna Battles, MD   Outpatient Specialists:     Brief Narrative:   Malik Fleming is a 66 y.o. male with medical history significant of COPD/asthma, tobacco abuse, and chronic pain; who presents with complaints of nausea, vomiting and diarrhea for the last 24 hours. He been dealing with a right inguinal hernia for the last 6 months.  He had planned to wait until after the holidays to be seen by general surgery.  However, patient notes that he area was acutely painful. Notes having over 15 episodes of nonbloody emesis and diarrhea which worsened the pain.  He reports manually reducing hernia.  Associated symptoms included chest discomfort that he reports was associated from vomiting and tingling in his left shoulder.  He complains of more so generalized abdominal pain at this point.  Denies any shortness of breath, loss of consciousness, recent sick contacts, recent antibiotic use, or dysuria.     Assessment & Plan:   Principal Problem:   Diverticulitis Active Problems:   Tobacco user   COPD (chronic obstructive pulmonary disease) (HCC)   Hypokalemia   Right inguinal hernia   Suspected early diverticulitis:  -Acute  -CT imaging shows possible signs of early diverticulitis. - IV fluids -clear diet for now - Continue metronidazole and ciprofloxacin Pain control is an issue as patient has chronic pain and takes 25mg  of oxycodone as an outpatient, will provide short term IV mediations  Right inguinal hernia:  Known--- will follow outpatient with surgery  Nausea, vomiting, diarrhea:  - secondary to diverticulitis - Antiemetics prn  Chest Pain/discomfort:  -resolved  Hypokalemia:  -replete  COPD/asthma: Patient reports smoking since the age of 70 approximately a half a pack of cigarettes per day on average currently.   - Continue Anoro - Albuterol neb prn  sob/wheezing  Tobacco abuse - Counseled on the need of cessation of tobacco - Nicotine patch offered     DVT prophylaxis:  Lovenox   Code Status: Full Code   Family Communication:   Disposition Plan:     Consultants:      Subjective: C/o of pain in LLQ and not being able to sleep Tolerating liquid diet with out vomiting  Objective: Vitals:   04/09/17 1319 04/09/17 2123 04/09/17 2200 04/10/17 0352  BP: (!) 147/75 (!) 175/82 (!) 180/85 (!) 143/54  Pulse: (!) 59 (!) 52 (!) 52 (!) 51  Resp: 16 18 18 18   Temp: 98.4 F (36.9 C) 98.5 F (36.9 C) 98.2 F (36.8 C) 98.4 F (36.9 C)  TempSrc: Oral Oral Oral Oral  SpO2: 93% 96%  96%  Weight:      Height:        Intake/Output Summary (Last 24 hours) at 04/10/2017 1109 Last data filed at 04/10/2017 0539 Gross per 24 hour  Intake 2300 ml  Output 1200 ml  Net 1100 ml   Filed Weights   04/08/17 1924 04/09/17 0311  Weight: 79.4 kg (175 lb) 75.9 kg (167 lb 4.8 oz)    Examination:  General exam: in bed, IV leaking Respiratory system: no wheezing Cardiovascular system: rrr Gastrointestinal system: +BS, tender in LLQ Central nervous system: Alert  Extremities: moves all 4 ext     Data Reviewed: I have personally reviewed following labs and imaging studies  CBC: Recent Labs  Lab 04/08/17 1925 04/09/17 0656 04/10/17 0945  WBC 8.6 8.1 4.9  HGB 15.3 13.1 14.2  HCT 43.6 38.3*  41.6  MCV 100.9* 102.7* 103.7*  PLT 209 185 782   Basic Metabolic Panel: Recent Labs  Lab 04/08/17 1925 04/09/17 0656  NA 138 136  K 3.0* 3.4*  CL 102 104  CO2 22 23  GLUCOSE 130* 100*  BUN <5* <5*  CREATININE 0.67 0.69  CALCIUM 8.8* 8.0*   GFR: Estimated Creatinine Clearance: 92.5 mL/min (by C-G formula based on SCr of 0.69 mg/dL). Liver Function Tests: Recent Labs  Lab 04/08/17 1930  AST 33  ALT 23  ALKPHOS 79  BILITOT 0.9  PROT 7.2  ALBUMIN 3.9   Recent Labs  Lab 04/08/17 1930  LIPASE 28   No results  for input(s): AMMONIA in the last 168 hours. Coagulation Profile: No results for input(s): INR, PROTIME in the last 168 hours. Cardiac Enzymes: Recent Labs  Lab 04/09/17 0204 04/09/17 0656 04/09/17 1111  TROPONINI <0.03 <0.03 <0.03   BNP (last 3 results) No results for input(s): PROBNP in the last 8760 hours. HbA1C: No results for input(s): HGBA1C in the last 72 hours. CBG: No results for input(s): GLUCAP in the last 168 hours. Lipid Profile: No results for input(s): CHOL, HDL, LDLCALC, TRIG, CHOLHDL, LDLDIRECT in the last 72 hours. Thyroid Function Tests: No results for input(s): TSH, T4TOTAL, FREET4, T3FREE, THYROIDAB in the last 72 hours. Anemia Panel: No results for input(s): VITAMINB12, FOLATE, FERRITIN, TIBC, IRON, RETICCTPCT in the last 72 hours. Urine analysis:    Component Value Date/Time   COLORURINE YELLOW 04/10/2017 0238   APPEARANCEUR CLEAR 04/10/2017 0238   LABSPEC 1.005 04/10/2017 0238   PHURINE 7.0 04/10/2017 0238   GLUCOSEU NEGATIVE 04/10/2017 0238   HGBUR NEGATIVE 04/10/2017 0238   BILIRUBINUR NEGATIVE 04/10/2017 0238   KETONESUR NEGATIVE 04/10/2017 0238   PROTEINUR NEGATIVE 04/10/2017 0238   NITRITE NEGATIVE 04/10/2017 0238   LEUKOCYTESUR NEGATIVE 04/10/2017 0238    )No results found for this or any previous visit (from the past 240 hour(s)).    Anti-infectives (From admission, onward)   Start     Dose/Rate Route Frequency Ordered Stop   04/09/17 1200  ciprofloxacin (CIPRO) IVPB 400 mg     400 mg 200 mL/hr over 60 Minutes Intravenous Every 12 hours 04/09/17 0056     04/09/17 0800  metroNIDAZOLE (FLAGYL) IVPB 500 mg     500 mg 100 mL/hr over 60 Minutes Intravenous Every 8 hours 04/09/17 0057     04/09/17 0000  ciprofloxacin (CIPRO) IVPB 400 mg     400 mg 200 mL/hr over 60 Minutes Intravenous  Once 04/08/17 2351 04/09/17 0146   04/09/17 0000  metroNIDAZOLE (FLAGYL) IVPB 500 mg     500 mg 100 mL/hr over 60 Minutes Intravenous  Once 04/08/17 2351  04/09/17 0247       Radiology Studies: Dg Chest 2 View  Result Date: 04/08/2017 CLINICAL DATA:  Central chest pain, dyspnea, nausea and vomiting x1 day. EXAM: CHEST  2 VIEW COMPARISON:  02/04/2017 FINDINGS: The heart size and mediastinal contours are within normal limits. There is minimal aortic atherosclerosis at the arch. Speckled metallic radiopaque foreign bodies are again re- demonstrated projecting over the posterior upper thorax. There is emphysematous hyperinflation of the lungs with chronic left basilar scarring. Blunting of left costophrenic angle is more accentuated on current exam consistent with new small left pleural effusion. No acute osseous abnormality. IMPRESSION: 1. Emphysematous hyperinflation of the lungs with left base scarring. New small left pleural effusion without overt pulmonary edema. Other chronic findings as above. Electronically Signed  By: Ashley Royalty M.D.   On: 04/08/2017 21:07   Ct Abdomen Pelvis W Contrast  Result Date: 04/08/2017 CLINICAL DATA:  Generalized abdominal pain, nausea and vomiting, beginning tonight. EXAM: CT ABDOMEN AND PELVIS WITH CONTRAST TECHNIQUE: Multidetector CT imaging of the abdomen and pelvis was performed using the standard protocol following bolus administration of intravenous contrast. CONTRAST:  16mL ISOVUE-300 IOPAMIDOL (ISOVUE-300) INJECTION 61% COMPARISON:  02/04/2017 FINDINGS: Lower chest: Emphysematous changes and fibrosis in the lung bases. Small esophageal hiatal hernia. Hepatobiliary: Diffuse fatty infiltration of the liver. No focal liver abnormality is seen. Status post cholecystectomy. No biliary dilatation. Pancreas: Unremarkable. No pancreatic ductal dilatation or surrounding inflammatory changes. Spleen: Normal in size without focal abnormality. Adrenals/Urinary Tract: Adrenal glands are unremarkable. Kidneys are normal, without renal calculi, focal lesion, or hydronephrosis. Bladder is unremarkable. Stomach/Bowel: Stomach,  small bowel, and colon are not abnormally distended. No wall thickening. Diverticulosis of the sigmoid and descending colon. Suggestion of focal infiltration around the junction of the descending and sigmoid region which may indicate early changes of diverticulitis. No abscess. Appendix is not identified. Vascular/Lymphatic: Aortic atherosclerosis. No enlarged abdominal or pelvic lymph nodes. Reproductive: Prostate is unremarkable. Other: Small right inguinal hernia containing fat. No free air or free fluid in the abdomen. Musculoskeletal: No acute or significant osseous findings. IMPRESSION: 1. Colonic diverticulosis with suggestion of mild inflammatory changes at the junction of the descending and sigmoid colon. This may indicate early diverticulitis. No abscess. No obstruction. 2. Emphysematous changes and fibrosis in the lung bases. 3. Diffuse fatty infiltration of the liver. 4. Small esophageal hiatal hernia. 5. Aortic atherosclerosis. 6. Small right inguinal hernia containing fat. Electronically Signed   By: Lucienne Capers M.D.   On: 04/08/2017 23:41        Scheduled Meds: . enoxaparin (LOVENOX) injection  40 mg Subcutaneous Q24H  . oxyCODONE  10 mg Oral Q24H  . oxyCODONE  10 mg Oral Q24H  . umeclidinium-vilanterol  1 puff Inhalation Daily   Continuous Infusions: . 0.9 % NaCl with KCl 20 mEq / L 75 mL/hr at 04/09/17 0943  . ciprofloxacin Stopped (04/10/17 0211)  . metronidazole 500 mg (04/10/17 1018)     LOS: 0 days    Time spent: 25 min    [place], DO [facility] Pager [phone]  If 7PM-7AM, please contact night-coverage [email or web address] Password [email or web address] 04/10/2017, 11:09 AM

## 2017-04-11 DIAGNOSIS — K5792 Diverticulitis of intestine, part unspecified, without perforation or abscess without bleeding: Secondary | ICD-10-CM | POA: Diagnosis not present

## 2017-04-11 MED ORDER — PROMETHAZINE HCL 25 MG/ML IJ SOLN
12.5000 mg | Freq: Four times a day (QID) | INTRAMUSCULAR | Status: DC | PRN
  Administered 2017-04-11: 12.5 mg via INTRAVENOUS
  Filled 2017-04-11 (×2): qty 1

## 2017-04-11 MED ORDER — HYOSCYAMINE SULFATE 0.5 MG/ML IJ SOLN
0.5000 mg | INTRAMUSCULAR | Status: DC | PRN
  Administered 2017-04-11: 0.5 mg via INTRAVENOUS
  Filled 2017-04-11 (×2): qty 1

## 2017-04-11 NOTE — Progress Notes (Signed)
PROGRESS NOTE    Malik Fleming  LTJ:[id number] DOB: Jul 21, 1950 DOA: 04/08/2017 PCP: Leanna Battles, MD   Outpatient Specialists:     Brief Narrative:   Malik Fleming is a 66 y.o. male with medical history significant of COPD/asthma, tobacco abuse, and chronic pain; who presents with complaints of nausea, vomiting and diarrhea for the last 24 hours. He been dealing with a right inguinal hernia for the last 6 months.  He had planned to wait until after the holidays to be seen by general surgery.  However, patient notes that he area was acutely painful. Notes having over 15 episodes of nonbloody emesis and diarrhea which worsened the pain.  He reports manually reducing hernia.  Associated symptoms included chest discomfort that he reports was associated from vomiting and tingling in his left shoulder.  He complains of more so generalized abdominal pain at this point.  Denies any shortness of breath, loss of consciousness, recent sick contacts, recent antibiotic use, or dysuria.  Assessment & Plan:   Principal Problem:   Diverticulitis Active Problems:   Tobacco user   COPD (chronic obstructive pulmonary disease) (HCC)   Hypokalemia   Right inguinal hernia   Suspected early diverticulitis:  - Acute  - CT imaging shows possible signs of early diverticulitis. - IV fluids - clear diet for now advance with improvement in condition but patient is still having bouts of emesis. - Continue metronidazole and ciprofloxacin Pain control is an issue as patient has chronic pain and takes 25 mg of oxycodone as an outpatient, discussed with nursing, pharmacist, and patient.  Right inguinal hernia:  Known--- will follow outpatient with surgery  Nausea, vomiting, diarrhea:  - secondary to diverticulitis - continue Antiemetics prn  Chest Pain/discomfort:  -resolved  Hypokalemia:  -repleted  COPD/asthma: Patient reports smoking since the age of 71 approximately a half a pack of  cigarettes per day on average currently.   - Continue Anoro - Albuterol neb prn sob/wheezing  Tobacco abuse - Counseled on the need of cessation of tobacco - Nicotine patch offered   DVT prophylaxis:  Lovenox   Code Status: Full Code   Family Communication:   Disposition Plan: pending improvement in condition    Consultants:    Subjective: Pt still complaning of nausea/emesis (overnight)  Objective: Vitals:   04/10/17 2240 04/11/17 0637 04/11/17 0839 04/11/17 1430  BP: 132/69 (!) 154/78  (!) 166/78  Pulse: 61 72  64  Resp: 17 17  18   Temp: 98.2 F (36.8 C) 99 F (37.2 C)  98.7 F (37.1 C)  TempSrc: Oral Oral  Oral  SpO2: 94% 97% 95% 93%  Weight:      Height:        Intake/Output Summary (Last 24 hours) at 04/11/2017 1522 Last data filed at 04/11/2017 1429 Gross per 24 hour  Intake 2936.25 ml  Output -  Net 2936.25 ml   Filed Weights   04/08/17 1924 04/09/17 0311  Weight: 79.4 kg (175 lb) 75.9 kg (167 lb 4.8 oz)    Examination:  General exam: in bed, in nad. Alert and awake Respiratory system: no wheezing, equal chest rise Cardiovascular system: rrr, no murmurs or rubs Gastrointestinal system: +BS, tender in LLQ Central nervous system: Alert, no facial asymmetry, moves extremities equally Extremities: moves all 4 ext, warm no cyanosis   Data Reviewed: I have personally reviewed following labs and imaging studies  CBC: Recent Labs  Lab 04/08/17 1925 04/09/17 0656 04/10/17 0945  WBC 8.6 8.1 4.9  HGB 15.3 13.1 14.2  HCT 43.6 38.3* 41.6  MCV 100.9* 102.7* 103.7*  PLT 209 185 967   Basic Metabolic Panel: Recent Labs  Lab 04/08/17 1925 04/09/17 0656 04/10/17 0945  NA 138 136 136  K 3.0* 3.4* 3.7  CL 102 104 105  CO2 22 23 22   GLUCOSE 130* 100* 119*  BUN <5* <5* <5*  CREATININE 0.67 0.69 0.71  CALCIUM 8.8* 8.0* 8.8*   GFR: Estimated Creatinine Clearance: 92.5 mL/min (by C-G formula based on SCr of 0.71 mg/dL). Liver Function  Tests: Recent Labs  Lab 04/08/17 1930  AST 33  ALT 23  ALKPHOS 79  BILITOT 0.9  PROT 7.2  ALBUMIN 3.9   Recent Labs  Lab 04/08/17 1930  LIPASE 28   No results for input(s): AMMONIA in the last 168 hours. Coagulation Profile: No results for input(s): INR, PROTIME in the last 168 hours. Cardiac Enzymes: Recent Labs  Lab 04/09/17 0204 04/09/17 0656 04/09/17 1111  TROPONINI <0.03 <0.03 <0.03   BNP (last 3 results) No results for input(s): PROBNP in the last 8760 hours. HbA1C: No results for input(s): HGBA1C in the last 72 hours. CBG: No results for input(s): GLUCAP in the last 168 hours. Lipid Profile: No results for input(s): CHOL, HDL, LDLCALC, TRIG, CHOLHDL, LDLDIRECT in the last 72 hours. Thyroid Function Tests: No results for input(s): TSH, T4TOTAL, FREET4, T3FREE, THYROIDAB in the last 72 hours. Anemia Panel: No results for input(s): VITAMINB12, FOLATE, FERRITIN, TIBC, IRON, RETICCTPCT in the last 72 hours. Urine analysis:    Component Value Date/Time   COLORURINE YELLOW 04/10/2017 0238   APPEARANCEUR CLEAR 04/10/2017 0238   LABSPEC 1.005 04/10/2017 0238   PHURINE 7.0 04/10/2017 0238   GLUCOSEU NEGATIVE 04/10/2017 0238   HGBUR NEGATIVE 04/10/2017 0238   BILIRUBINUR NEGATIVE 04/10/2017 0238   KETONESUR NEGATIVE 04/10/2017 0238   PROTEINUR NEGATIVE 04/10/2017 0238   NITRITE NEGATIVE 04/10/2017 0238   LEUKOCYTESUR NEGATIVE 04/10/2017 0238    )No results found for this or any previous visit (from the past 240 hour(s)).    Anti-infectives (From admission, onward)   Start     Dose/Rate Route Frequency Ordered Stop   04/09/17 1200  ciprofloxacin (CIPRO) IVPB 400 mg     400 mg 200 mL/hr over 60 Minutes Intravenous Every 12 hours 04/09/17 0056     04/09/17 0800  metroNIDAZOLE (FLAGYL) IVPB 500 mg     500 mg 100 mL/hr over 60 Minutes Intravenous Every 8 hours 04/09/17 0057     04/09/17 0000  ciprofloxacin (CIPRO) IVPB 400 mg     400 mg 200 mL/hr over 60  Minutes Intravenous  Once 04/08/17 2351 04/09/17 0146   04/09/17 0000  metroNIDAZOLE (FLAGYL) IVPB 500 mg     500 mg 100 mL/hr over 60 Minutes Intravenous  Once 04/08/17 2351 04/09/17 0247       Radiology Studies: No results found.      Scheduled Meds: . enoxaparin (LOVENOX) injection  40 mg Subcutaneous Q24H  . oxyCODONE  10 mg Oral Q24H  . oxyCODONE  10 mg Oral Q24H  . umeclidinium-vilanterol  1 puff Inhalation Daily   Continuous Infusions: . 0.9 % NaCl with KCl 20 mEq / L 75 mL/hr at 04/11/17 0621  . ciprofloxacin Stopped (04/11/17 1337)  . metronidazole Stopped (04/11/17 1337)     LOS: 1 day    Time spent: 25 min  Velvet Bathe, DO [facility] Pager [phone]  If 7PM-7AM, please contact night-coverage [email or web address] Password [facility] 04/11/2017,  3:22 PM

## 2017-04-12 ENCOUNTER — Other Ambulatory Visit: Payer: Self-pay

## 2017-04-12 DIAGNOSIS — K5792 Diverticulitis of intestine, part unspecified, without perforation or abscess without bleeding: Secondary | ICD-10-CM | POA: Diagnosis not present

## 2017-04-12 MED ORDER — OXYCODONE HCL ER 10 MG PO T12A
20.0000 mg | EXTENDED_RELEASE_TABLET | Freq: Two times a day (BID) | ORAL | Status: DC
  Administered 2017-04-12 (×2): 20 mg via ORAL
  Filled 2017-04-12 (×2): qty 2

## 2017-04-12 NOTE — Progress Notes (Signed)
PROGRESS NOTE    Malik Fleming  GEX:[id number] DOB: [birth year] DOA: 04/08/2017 PCP: Leanna Battles, MD   Outpatient Specialists:     Brief Narrative:   Malik Fleming is a 66 y.o. male with medical history significant of COPD/asthma, tobacco abuse, and chronic pain; who presents with complaints of nausea, vomiting and diarrhea for the last 24 hours. He been dealing with a right inguinal hernia for the last 6 months.  He had planned to wait until after the holidays to be seen by general surgery.  However, patient notes that he area was acutely painful. Notes having over 15 episodes of nonbloody emesis and diarrhea which worsened the pain.  He reports manually reducing hernia.  Associated symptoms included chest discomfort that he reports was associated from vomiting and tingling in his left shoulder.  He complains of more so generalized abdominal pain at this point.  Denies any shortness of breath, loss of consciousness, recent sick contacts, recent antibiotic use, or dysuria.  Assessment & Plan:   Principal Problem:   Diverticulitis Active Problems:   Tobacco user   COPD (chronic obstructive pulmonary disease) (HCC)   Hypokalemia   Right inguinal hernia   Suspected early diverticulitis:  - Acute  - CT imaging shows possible signs of early diverticulitis. - IV fluids - clear diet for now advance with improvement in condition but patient is still having bouts of emesis. - Continue metronidazole and ciprofloxacin Pain control is an issue as patient has chronic pain and takes 25 mg of oxycodone as an outpatient, will place on long acting and short acting pain medication regimen. - will advance diet to full liquid diet.  Right inguinal hernia:  Known--- will follow outpatient with surgery  Nausea, vomiting, diarrhea:  - secondary to diverticulitis - continue Antiemetics prn  Chest Pain/discomfort:  -resolved  Hypokalemia:  -repleted  COPD/asthma: Patient reports  smoking since the age of 24 approximately a half a pack of cigarettes per day on average currently.   - Continue Anoro - Albuterol neb prn sob/wheezing  Tobacco abuse - Counseled on the need of cessation of tobacco - Nicotine patch offered   DVT prophylaxis:  Lovenox   Code Status: Full Code   Family Communication: none at beside   Disposition Plan: pending improvement in condition  Consultants:   Subjective: He reports improvement in abdominal discomfort today and would like to try advancing his diet.   Objective: Vitals:   04/12/17 0538 04/12/17 0759 04/12/17 1316 04/12/17 1320  BP: 130/80  (!) 153/74 127/87  Pulse: (!) 58  (!) 58 71  Resp: 18  18 17   Temp: 98.5 F (36.9 C)  98.4 F (36.9 C) 98.3 F (36.8 C)  TempSrc: Oral  Oral Oral  SpO2: 95% 99% 96% 100%  Weight:      Height:        Intake/Output Summary (Last 24 hours) at 04/12/2017 1348 Last data filed at 04/12/2017 0951 Gross per 24 hour  Intake 900 ml  Output -  Net 900 ml   Filed Weights   04/08/17 1924 04/09/17 0311  Weight: 79.4 kg (175 lb) 75.9 kg (167 lb 4.8 oz)    Examination:  General exam: in bed, in nad. Alert and awake Respiratory system: no wheezing, equal chest rise Cardiovascular system: rrr, no murmurs or rubs Gastrointestinal system: +BS, tender in LLQ Central nervous system: Alert, no facial asymmetry, moves extremities equally Extremities: moves all 4 ext, warm no cyanosis   Data Reviewed: I have  personally reviewed following labs and imaging studies  CBC: Recent Labs  Lab 04/08/17 1925 04/09/17 0656 04/10/17 0945  WBC 8.6 8.1 4.9  HGB 15.3 13.1 14.2  HCT 43.6 38.3* 41.6  MCV 100.9* 102.7* 103.7*  PLT 209 185 409   Basic Metabolic Panel: Recent Labs  Lab 04/08/17 1925 04/09/17 0656 04/10/17 0945  NA 138 136 136  K 3.0* 3.4* 3.7  CL 102 104 105  CO2 22 23 22   GLUCOSE 130* 100* 119*  BUN <5* <5* <5*  CREATININE 0.67 0.69 0.71  CALCIUM 8.8* 8.0* 8.8*     GFR: Estimated Creatinine Clearance: 92.5 mL/min (by C-G formula based on SCr of 0.71 mg/dL). Liver Function Tests: Recent Labs  Lab 04/08/17 1930  AST 33  ALT 23  ALKPHOS 79  BILITOT 0.9  PROT 7.2  ALBUMIN 3.9   Recent Labs  Lab 04/08/17 1930  LIPASE 28   No results for input(s): AMMONIA in the last 168 hours. Coagulation Profile: No results for input(s): INR, PROTIME in the last 168 hours. Cardiac Enzymes: Recent Labs  Lab 04/09/17 0204 04/09/17 0656 04/09/17 1111  TROPONINI <0.03 <0.03 <0.03   BNP (last 3 results) No results for input(s): PROBNP in the last 8760 hours. HbA1C: No results for input(s): HGBA1C in the last 72 hours. CBG: No results for input(s): GLUCAP in the last 168 hours. Lipid Profile: No results for input(s): CHOL, HDL, LDLCALC, TRIG, CHOLHDL, LDLDIRECT in the last 72 hours. Thyroid Function Tests: No results for input(s): TSH, T4TOTAL, FREET4, T3FREE, THYROIDAB in the last 72 hours. Anemia Panel: No results for input(s): VITAMINB12, FOLATE, FERRITIN, TIBC, IRON, RETICCTPCT in the last 72 hours. Urine analysis:    Component Value Date/Time   COLORURINE YELLOW 04/10/2017 0238   APPEARANCEUR CLEAR 04/10/2017 0238   LABSPEC 1.005 04/10/2017 0238   PHURINE 7.0 04/10/2017 0238   GLUCOSEU NEGATIVE 04/10/2017 0238   HGBUR NEGATIVE 04/10/2017 0238   BILIRUBINUR NEGATIVE 04/10/2017 0238   KETONESUR NEGATIVE 04/10/2017 0238   PROTEINUR NEGATIVE 04/10/2017 0238   NITRITE NEGATIVE 04/10/2017 0238   LEUKOCYTESUR NEGATIVE 04/10/2017 0238    )No results found for this or any previous visit (from the past 240 hour(s)).    Anti-infectives (From admission, onward)   Start     Dose/Rate Route Frequency Ordered Stop   04/09/17 1200  ciprofloxacin (CIPRO) IVPB 400 mg     400 mg 200 mL/hr over 60 Minutes Intravenous Every 12 hours 04/09/17 0056     04/09/17 0800  metroNIDAZOLE (FLAGYL) IVPB 500 mg     500 mg 100 mL/hr over 60 Minutes Intravenous  Every 8 hours 04/09/17 0057     04/09/17 0000  ciprofloxacin (CIPRO) IVPB 400 mg     400 mg 200 mL/hr over 60 Minutes Intravenous  Once 04/08/17 2351 04/09/17 0146   04/09/17 0000  metroNIDAZOLE (FLAGYL) IVPB 500 mg     500 mg 100 mL/hr over 60 Minutes Intravenous  Once 04/08/17 2351 04/09/17 0247       Radiology Studies: No results found.      Scheduled Meds: . enoxaparin (LOVENOX) injection  40 mg Subcutaneous Q24H  . oxyCODONE  10 mg Oral Q24H  . oxyCODONE  10 mg Oral Q24H  . umeclidinium-vilanterol  1 puff Inhalation Daily   Continuous Infusions: . ciprofloxacin Stopped (04/12/17 1337)  . metronidazole Stopped (04/12/17 1003)     LOS: 2 days    Time spent: 25 min  Velvet Bathe, DO [facility] Pager [phone]  If 7PM-7AM, please contact night-coverage [email or web address] Password [email or web address] 04/12/2017, 1:48 PM

## 2017-04-13 DIAGNOSIS — K5792 Diverticulitis of intestine, part unspecified, without perforation or abscess without bleeding: Secondary | ICD-10-CM | POA: Diagnosis not present

## 2017-04-13 MED ORDER — MORPHINE SULFATE (PF) 2 MG/ML IV SOLN
1.0000 mg | INTRAVENOUS | Status: DC | PRN
  Administered 2017-04-13: 1 mg via INTRAVENOUS
  Filled 2017-04-13: qty 1

## 2017-04-13 MED ORDER — OXYCODONE HCL 5 MG PO TABS
10.0000 mg | ORAL_TABLET | Freq: Four times a day (QID) | ORAL | Status: DC | PRN
  Administered 2017-04-15: 10 mg via ORAL
  Filled 2017-04-13: qty 2

## 2017-04-13 MED ORDER — HYDROMORPHONE HCL 1 MG/ML IJ SOLN
1.0000 mg | INTRAMUSCULAR | Status: DC | PRN
  Administered 2017-04-13 – 2017-04-15 (×17): 1 mg via INTRAVENOUS
  Filled 2017-04-13 (×17): qty 1

## 2017-04-13 MED ORDER — ALUM & MAG HYDROXIDE-SIMETH 200-200-20 MG/5ML PO SUSP
30.0000 mL | ORAL | Status: DC | PRN
  Administered 2017-04-13: 30 mL via ORAL
  Filled 2017-04-13: qty 30

## 2017-04-13 NOTE — Plan of Care (Signed)
Progressing slowly 

## 2017-04-13 NOTE — Progress Notes (Signed)
Pt's having uncontrolled nausea and in pain. All pain meds are PO. MD notified if he can  have IV pain med ordered.

## 2017-04-13 NOTE — Progress Notes (Signed)
Pt refused vital signs.

## 2017-04-13 NOTE — Progress Notes (Signed)
PROGRESS NOTE    Malik Fleming  MVH:[id number] DOB: [birth year] DOA: 04/08/2017 PCP: Leanna Battles, MD   Outpatient Specialists:     Brief Narrative:   Malik Fleming is a 66 y.o. male with medical history significant of COPD/asthma, tobacco abuse, and chronic pain; who presents with complaints of nausea, vomiting and diarrhea for the last 24 hours. He been dealing with a right inguinal hernia for the last 6 months.  He had planned to wait until after the holidays to be seen by general surgery.  However, patient notes that he area was acutely painful. Notes having over 15 episodes of nonbloody emesis and diarrhea which worsened the pain.  He reports manually reducing hernia.  Associated symptoms included chest discomfort that he reports was associated from vomiting and tingling in his left shoulder.  He complains of more so generalized abdominal pain at this point.  Denies any shortness of breath, loss of consciousness, recent sick contacts, recent antibiotic use, or dysuria.  Assessment & Plan:   Principal Problem:   Diverticulitis Active Problems:   Tobacco user   COPD (chronic obstructive pulmonary disease) (HCC)   Hypokalemia   Right inguinal hernia   Suspected early diverticulitis:  - Acute  - CT imaging shows possible signs of early diverticulitis. - Patient got worse after advancing diet. Will schedule it back to clear liquid diet and place him back on IV pain medication. We'll also have oral pain medication as patient conditions improves seeking continue to take his orals. But he reported he was unable to keep his oral pain medication down secondary to nausea and emesis.  Right inguinal hernia:  Known--- will follow outpatient with surgery  Nausea, vomiting, diarrhea:  - secondary to diverticulitis, Worse after advancing diet. - continue Antiemetics prn  Chest Pain/discomfort:  -resolved  Hypokalemia:  -repleted  COPD/asthma: Patient reports smoking since  the age of 66 approximately a half a pack of cigarettes per day on average currently.   - Continue Anoro - Albuterol neb prn sob/wheezing  Tobacco abuse - Counseled on the need of cessation of tobacco - Nicotine patch offered  DVT prophylaxis:  Lovenox   Code Status: Full Code   Family Communication: none at beside   Disposition Plan: pending improvement in condition  Consultants:   Subjective: Patient reports increase in nausea and abdominal discomfort after advancing diet  Objective: Vitals:   04/12/17 1316 04/12/17 1320 04/12/17 2137 04/13/17 1439  BP: (!) 153/74 127/87 136/83 (!) 141/68  Pulse: (!) 58 71 60 65  Resp: 18 17 18 16   Temp: 98.4 F (36.9 C) 98.3 F (36.8 C) 99.1 F (37.3 C) 98.5 F (36.9 C)  TempSrc: Oral Oral Oral Oral  SpO2: 96% 100% 95% 97%  Weight:      Height:        Intake/Output Summary (Last 24 hours) at 04/13/2017 1506 Last data filed at 04/13/2017 0542 Gross per 24 hour  Intake 120 ml  Output 400 ml  Net -280 ml   Filed Weights   04/08/17 1924 04/09/17 0311  Weight: 79.4 kg (175 lb) 75.9 kg (167 lb 4.8 oz)    Examination:  General exam: in bed, in nad. Alert and awake Respiratory system: no wheezing, equal chest rise Cardiovascular system: rrr, no murmurs or rubs Gastrointestinal system: +BS, tender in LLQ Central nervous system: Alert, no facial asymmetry, moves extremities equally Extremities: moves all 4 ext, warm no cyanosis   Data Reviewed: I have personally reviewed following labs and  imaging studies  CBC: Recent Labs  Lab 04/08/17 1925 04/09/17 0656 04/10/17 0945  WBC 8.6 8.1 4.9  HGB 15.3 13.1 14.2  HCT 43.6 38.3* 41.6  MCV 100.9* 102.7* 103.7*  PLT 209 185 703   Basic Metabolic Panel: Recent Labs  Lab 04/08/17 1925 04/09/17 0656 04/10/17 0945  NA 138 136 136  K 3.0* 3.4* 3.7  CL 102 104 105  CO2 22 23 22   GLUCOSE 130* 100* 119*  BUN <5* <5* <5*  CREATININE 0.67 0.69 0.71  CALCIUM 8.8* 8.0*  8.8*   GFR: Estimated Creatinine Clearance: 92.5 mL/min (by C-G formula based on SCr of 0.71 mg/dL). Liver Function Tests: Recent Labs  Lab 04/08/17 1930  AST 33  ALT 23  ALKPHOS 79  BILITOT 0.9  PROT 7.2  ALBUMIN 3.9   Recent Labs  Lab 04/08/17 1930  LIPASE 28   No results for input(s): AMMONIA in the last 168 hours. Coagulation Profile: No results for input(s): INR, PROTIME in the last 168 hours. Cardiac Enzymes: Recent Labs  Lab 04/09/17 0204 04/09/17 0656 04/09/17 1111  TROPONINI <0.03 <0.03 <0.03   BNP (last 3 results) No results for input(s): PROBNP in the last 8760 hours. HbA1C: No results for input(s): HGBA1C in the last 72 hours. CBG: No results for input(s): GLUCAP in the last 168 hours. Lipid Profile: No results for input(s): CHOL, HDL, LDLCALC, TRIG, CHOLHDL, LDLDIRECT in the last 72 hours. Thyroid Function Tests: No results for input(s): TSH, T4TOTAL, FREET4, T3FREE, THYROIDAB in the last 72 hours. Anemia Panel: No results for input(s): VITAMINB12, FOLATE, FERRITIN, TIBC, IRON, RETICCTPCT in the last 72 hours. Urine analysis:    Component Value Date/Time   COLORURINE YELLOW 04/10/2017 0238   APPEARANCEUR CLEAR 04/10/2017 0238   LABSPEC 1.005 04/10/2017 0238   PHURINE 7.0 04/10/2017 0238   GLUCOSEU NEGATIVE 04/10/2017 0238   HGBUR NEGATIVE 04/10/2017 0238   BILIRUBINUR NEGATIVE 04/10/2017 0238   KETONESUR NEGATIVE 04/10/2017 0238   PROTEINUR NEGATIVE 04/10/2017 0238   NITRITE NEGATIVE 04/10/2017 0238   LEUKOCYTESUR NEGATIVE 04/10/2017 0238    )No results found for this or any previous visit (from the past 240 hour(s)).    Anti-infectives (From admission, onward)   Start     Dose/Rate Route Frequency Ordered Stop   04/09/17 1200  ciprofloxacin (CIPRO) IVPB 400 mg     400 mg 200 mL/hr over 60 Minutes Intravenous Every 12 hours 04/09/17 0056     04/09/17 0800  metroNIDAZOLE (FLAGYL) IVPB 500 mg     500 mg 100 mL/hr over 60 Minutes  Intravenous Every 8 hours 04/09/17 0057     04/09/17 0000  ciprofloxacin (CIPRO) IVPB 400 mg     400 mg 200 mL/hr over 60 Minutes Intravenous  Once 04/08/17 2351 04/09/17 0146   04/09/17 0000  metroNIDAZOLE (FLAGYL) IVPB 500 mg     500 mg 100 mL/hr over 60 Minutes Intravenous  Once 04/08/17 2351 04/09/17 0247       Radiology Studies: No results found.      Scheduled Meds: . enoxaparin (LOVENOX) injection  40 mg Subcutaneous Q24H  . umeclidinium-vilanterol  1 puff Inhalation Daily   Continuous Infusions: . ciprofloxacin Stopped (04/13/17 1449)  . metronidazole Stopped (04/13/17 1103)     LOS: 3 days    Time spent: 25 min  Velvet Bathe, DO [facility] Pager [phone]  If 7PM-7AM, please contact night-coverage [email or web address] Password [email or web address] 04/13/2017, 3:06 PM

## 2017-04-14 DIAGNOSIS — K5792 Diverticulitis of intestine, part unspecified, without perforation or abscess without bleeding: Secondary | ICD-10-CM | POA: Diagnosis not present

## 2017-04-14 NOTE — Progress Notes (Signed)
PROGRESS NOTE    Malik Fleming  PNT:[id number] DOB: 1951/01/10 DOA: 04/08/2017 PCP: Leanna Battles, MD   Outpatient Specialists:   Brief Narrative:   Malik Fleming is a 66 y.o. male with medical history significant of COPD/asthma, tobacco abuse, and chronic pain; who presents with complaints of nausea, vomiting and diarrhea for the last 24 hours. He been dealing with a right inguinal hernia for the last 6 months.  He had planned to wait until after the holidays to be seen by general surgery.  However, patient notes that he area was acutely painful. Notes having over 15 episodes of nonbloody emesis and diarrhea which worsened the pain.  He reports manually reducing hernia.  Associated symptoms included chest discomfort that he reports was associated from vomiting and tingling in his left shoulder.  He complains of more so generalized abdominal pain at this point.  Denies any shortness of breath, loss of consciousness, recent sick contacts, recent antibiotic use, or dysuria.  Assessment & Plan:   Principal Problem:   Diverticulitis Active Problems:   Tobacco user   COPD (chronic obstructive pulmonary disease) (HCC)   Hypokalemia   Right inguinal hernia   Suspected early diverticulitis:  - Acute  - CT imaging shows possible signs of early diverticulitis. - Patient got worse after advancing diet. Improved today he reports not having as much nausea and improvement in abdominal pain. Abdominal discomfort is improving.  Right inguinal hernia:  Known--- will follow outpatient with surgery  Nausea, vomiting, diarrhea:  - secondary to diverticulitis, Worse after advancing diet. - continue Antiemetics prn  Chest Pain/discomfort:  -resolved  Hypokalemia:  -repleted  COPD/asthma: Patient reports smoking since the age of 69 approximately a half a pack of cigarettes per day on average currently.   - Continue Anoro - Albuterol neb prn sob/wheezing  Tobacco abuse - Counseled  on the need of cessation of tobacco - Nicotine patch offered  DVT prophylaxis:  Lovenox   Code Status: Full Code   Family Communication: none at beside   Disposition Plan: pending improvement in condition  Consultants:   Subjective: Patient reports increase in nausea and abdominal discomfort after advancing diet  Objective: Vitals:   04/12/17 2137 04/13/17 1439 04/13/17 2250 04/14/17 0912  BP: 136/83 (!) 141/68 134/73   Pulse: 60 65 64   Resp: 18 16 18    Temp: 99.1 F (37.3 C) 98.5 F (36.9 C) 98.1 F (36.7 C)   TempSrc: Oral Oral Oral   SpO2: 95% 97% 96% 92%  Weight:      Height:        Intake/Output Summary (Last 24 hours) at 04/14/2017 1423 Last data filed at 04/14/2017 0751 Gross per 24 hour  Intake 1300 ml  Output -  Net 1300 ml   Filed Weights   04/08/17 1924 04/09/17 0311  Weight: 79.4 kg (175 lb) 75.9 kg (167 lb 4.8 oz)    Examination:  General exam: in bed, in nad. Alert and awake Respiratory system: no wheezing, equal chest rise Cardiovascular system: rrr, no murmurs or rubs Gastrointestinal system: +BS, tender in LLQ Central nervous system: Alert, no facial asymmetry, moves extremities equally Extremities: moves all 4 ext, warm no cyanosis   Data Reviewed: I have personally reviewed following labs and imaging studies  CBC: Recent Labs  Lab 04/08/17 1925 04/09/17 0656 04/10/17 0945  WBC 8.6 8.1 4.9  HGB 15.3 13.1 14.2  HCT 43.6 38.3* 41.6  MCV 100.9* 102.7* 103.7*  PLT 209 185 180  Basic Metabolic Panel: Recent Labs  Lab 04/08/17 1925 04/09/17 0656 04/10/17 0945  NA 138 136 136  K 3.0* 3.4* 3.7  CL 102 104 105  CO2 22 23 22   GLUCOSE 130* 100* 119*  BUN <5* <5* <5*  CREATININE 0.67 0.69 0.71  CALCIUM 8.8* 8.0* 8.8*   GFR: Estimated Creatinine Clearance: 92.5 mL/min (by C-G formula based on SCr of 0.71 mg/dL). Liver Function Tests: Recent Labs  Lab 04/08/17 1930  AST 33  ALT 23  ALKPHOS 79  BILITOT 0.9  PROT  7.2  ALBUMIN 3.9   Recent Labs  Lab 04/08/17 1930  LIPASE 28   No results for input(s): AMMONIA in the last 168 hours. Coagulation Profile: No results for input(s): INR, PROTIME in the last 168 hours. Cardiac Enzymes: Recent Labs  Lab 04/09/17 0204 04/09/17 0656 04/09/17 1111  TROPONINI <0.03 <0.03 <0.03   BNP (last 3 results) No results for input(s): PROBNP in the last 8760 hours. HbA1C: No results for input(s): HGBA1C in the last 72 hours. CBG: No results for input(s): GLUCAP in the last 168 hours. Lipid Profile: No results for input(s): CHOL, HDL, LDLCALC, TRIG, CHOLHDL, LDLDIRECT in the last 72 hours. Thyroid Function Tests: No results for input(s): TSH, T4TOTAL, FREET4, T3FREE, THYROIDAB in the last 72 hours. Anemia Panel: No results for input(s): VITAMINB12, FOLATE, FERRITIN, TIBC, IRON, RETICCTPCT in the last 72 hours. Urine analysis:    Component Value Date/Time   COLORURINE YELLOW 04/10/2017 0238   APPEARANCEUR CLEAR 04/10/2017 0238   LABSPEC 1.005 04/10/2017 0238   PHURINE 7.0 04/10/2017 0238   GLUCOSEU NEGATIVE 04/10/2017 0238   HGBUR NEGATIVE 04/10/2017 0238   BILIRUBINUR NEGATIVE 04/10/2017 0238   KETONESUR NEGATIVE 04/10/2017 0238   PROTEINUR NEGATIVE 04/10/2017 0238   NITRITE NEGATIVE 04/10/2017 0238   LEUKOCYTESUR NEGATIVE 04/10/2017 0238    )No results found for this or any previous visit (from the past 240 hour(s)).    Anti-infectives (From admission, onward)   Start     Dose/Rate Route Frequency Ordered Stop   04/09/17 1200  ciprofloxacin (CIPRO) IVPB 400 mg     400 mg 200 mL/hr over 60 Minutes Intravenous Every 12 hours 04/09/17 0056     04/09/17 0800  metroNIDAZOLE (FLAGYL) IVPB 500 mg     500 mg 100 mL/hr over 60 Minutes Intravenous Every 8 hours 04/09/17 0057     04/09/17 0000  ciprofloxacin (CIPRO) IVPB 400 mg     400 mg 200 mL/hr over 60 Minutes Intravenous  Once 04/08/17 2351 04/09/17 0146   04/09/17 0000  metroNIDAZOLE (FLAGYL)  IVPB 500 mg     500 mg 100 mL/hr over 60 Minutes Intravenous  Once 04/08/17 2351 04/09/17 0247     Radiology Studies: No results found.  Scheduled Meds: . enoxaparin (LOVENOX) injection  40 mg Subcutaneous Q24H  . umeclidinium-vilanterol  1 puff Inhalation Daily   Continuous Infusions: . ciprofloxacin Stopped (04/14/17 1354)  . metronidazole Stopped (04/14/17 0851)     LOS: 4 days    Time spent: 25 min  Velvet Bathe, DO [facility] Pager [phone]  If 7PM-7AM, please contact night-coverage [email or web address] Password [facility] 04/14/2017, 2:23 PM

## 2017-04-15 DIAGNOSIS — K5792 Diverticulitis of intestine, part unspecified, without perforation or abscess without bleeding: Secondary | ICD-10-CM | POA: Diagnosis not present

## 2017-04-15 MED ORDER — METRONIDAZOLE 500 MG PO TABS: 500.0000 mg | ORAL_TABLET | Freq: Three times a day (TID) | ORAL | 0 refills | Status: DC

## 2017-04-15 MED ORDER — CIPROFLOXACIN HCL 500 MG PO TABS: 500.0000 mg | ORAL_TABLET | Freq: Two times a day (BID) | ORAL | 0 refills | Status: AC

## 2017-04-15 MED ORDER — METRONIDAZOLE 500 MG PO TABS: 500.0000 mg | ORAL_TABLET | Freq: Three times a day (TID) | ORAL | 0 refills | Status: AC

## 2017-04-15 MED ORDER — OXYCODONE-ACETAMINOPHEN 5-325 MG PO TABS: 1.0000 | ORAL_TABLET | Freq: Four times a day (QID) | ORAL | 0 refills | Status: AC | PRN

## 2017-04-15 NOTE — Progress Notes (Signed)
Patient discharge teaching given, including activity, diet, follow-up appoints, and medications. Patient verbalized understanding of all discharge instructions. IV access was d/c'd. Vitals are stable. Skin is intact except as charted in most recent assessments. Pt to be escorted out by Nurse, to be driven home by family. 

## 2017-04-15 NOTE — Discharge Summary (Addendum)
Physician Discharge Summary  Malik Fleming WJX:[id number] DOB: 14-Oct-1950 DOA: 04/08/2017  PCP: Leanna Battles, MD  Admit date: 04/08/2017 Discharge date: 04/15/2017  Time spent: > 35 minutes  Recommendations for Outpatient Follow-up:  1. Ensure you follow up GI specialist   Discharge Diagnoses:  Principal Problem:   Diverticulitis Active Problems:   Tobacco user   COPD (chronic obstructive pulmonary disease) ([place])   Hypokalemia   Right inguinal hernia   Discharge Condition: stable  Diet recommendation: soft diet  Filed Weights   04/08/17 1924 04/09/17 0311  Weight: 79.4 kg (175 lb) 75.9 kg (167 lb 4.8 oz)    History of present illness:  66 y.o. male with medical history significant of COPD/asthma, tobacco abuse, and chronic pain; who presents with complaints of nausea, vomiting and diarrhea for the last 24 hours  Diagnosed with diverticulitis  Hospital Course:  Diverticulitis - Pt is exhibiting some drug seeking behavior. Asking for dilaudid by name and continually requesting it from nurses even prior to being discharged - will d/c him on percocet 5-325 - CT of abdomen and pelvis reports diverticulosis suggetive of mild inflammatory changes at the junction of the descending and sigmoid colon  For other known medical conditions continue prior to admission medication regimen  Procedures:  none  Consultations:  None  Discharge Exam: Vitals:   04/15/17 0437 04/15/17 0813  BP: 116/66   Pulse: 62 65  Resp: 18 18  Temp: 98.3 F (36.8 C)   SpO2: 97% 98%    General: Patient in no acute distress, alert and awake Cardiovascular: Regular rate and rhythm, no murmurs or rubs Respiratory: No increased work of breathing, no wheezes  Discharge Instructions    Current Discharge Medication List    START taking these medications   Details  ciprofloxacin (CIPRO) 500 MG tablet Take 1 tablet (500 mg total) 2 (two) times daily for 7 days by mouth. Qty: 14  tablet, Refills: 0    metroNIDAZOLE (FLAGYL) 500 MG tablet Take 1 tablet (500 mg total) 3 (three) times daily for 7 days by mouth. Qty: 21 tablet, Refills: 0    oxyCODONE-acetaminophen (ROXICET) 5-325 MG tablet Take 1 tablet every 6 (six) hours as needed for up to 7 days by mouth. Qty: 30 tablet, Refills: 0      CONTINUE these medications which have NOT CHANGED   Details  Ascorbic Acid (VITAMIN C PO) Take 1 tablet by mouth daily.    Multiple Vitamin (MULTIVITAMIN WITH MINERALS) TABS tablet Take 1 tablet by mouth daily.    tiotropium (SPIRIVA) 18 MCG inhalation capsule Place 18 mcg into inhaler and inhale daily.    ondansetron (ZOFRAN) 4 MG tablet Take 1 tablet (4 mg total) by mouth every 6 (six) hours. Qty: 12 tablet, Refills: 0      STOP taking these medications     oxyCODONE (OXY IR/ROXICODONE) 5 MG immediate release tablet        Allergies  Allergen Reactions  . Asa [Aspirin] Nausea And Vomiting  . Penicillins Nausea And Vomiting   Follow-up Information    Autumn Messing III, MD. Schedule an appointment as soon as possible for a visit in 8 week(s).   Specialty:  General Surgery Why:  for follow up regarding elective repair of hernia. Contact information: [address] [place]  [place] [address] [phone]            The results of significant diagnostics from this hospitalization (including imaging, microbiology, ancillary and laboratory) are listed below  for reference.    Significant Diagnostic Studies: Dg Chest 2 View  Result Date: 04/08/2017 CLINICAL DATA:  Central chest pain, dyspnea, nausea and vomiting x1 day. EXAM: CHEST  2 VIEW COMPARISON:  02/04/2017 FINDINGS: The heart size and mediastinal contours are within normal limits. There is minimal aortic atherosclerosis at the arch. Speckled metallic radiopaque foreign bodies are again re- demonstrated projecting over the posterior upper thorax. There is emphysematous hyperinflation of the lungs with  chronic left basilar scarring. Blunting of left costophrenic angle is more accentuated on current exam consistent with new small left pleural effusion. No acute osseous abnormality. IMPRESSION: 1. Emphysematous hyperinflation of the lungs with left base scarring. New small left pleural effusion without overt pulmonary edema. Other chronic findings as above. Electronically Signed   By: Ashley Royalty M.D.   On: 04/08/2017 21:07   Ct Abdomen Pelvis W Contrast  Result Date: 04/08/2017 CLINICAL DATA:  Generalized abdominal pain, nausea and vomiting, beginning tonight. EXAM: CT ABDOMEN AND PELVIS WITH CONTRAST TECHNIQUE: Multidetector CT imaging of the abdomen and pelvis was performed using the standard protocol following bolus administration of intravenous contrast. CONTRAST:  140mL ISOVUE-300 IOPAMIDOL (ISOVUE-300) INJECTION 61% COMPARISON:  02/04/2017 FINDINGS: Lower chest: Emphysematous changes and fibrosis in the lung bases. Small esophageal hiatal hernia. Hepatobiliary: Diffuse fatty infiltration of the liver. No focal liver abnormality is seen. Status post cholecystectomy. No biliary dilatation. Pancreas: Unremarkable. No pancreatic ductal dilatation or surrounding inflammatory changes. Spleen: Normal in size without focal abnormality. Adrenals/Urinary Tract: Adrenal glands are unremarkable. Kidneys are normal, without renal calculi, focal lesion, or hydronephrosis. Bladder is unremarkable. Stomach/Bowel: Stomach, small bowel, and colon are not abnormally distended. No wall thickening. Diverticulosis of the sigmoid and descending colon. Suggestion of focal infiltration around the junction of the descending and sigmoid region which may indicate early changes of diverticulitis. No abscess. Appendix is not identified. Vascular/Lymphatic: Aortic atherosclerosis. No enlarged abdominal or pelvic lymph nodes. Reproductive: Prostate is unremarkable. Other: Small right inguinal hernia containing fat. No free air or free  fluid in the abdomen. Musculoskeletal: No acute or significant osseous findings. IMPRESSION: 1. Colonic diverticulosis with suggestion of mild inflammatory changes at the junction of the descending and sigmoid colon. This may indicate early diverticulitis. No abscess. No obstruction. 2. Emphysematous changes and fibrosis in the lung bases. 3. Diffuse fatty infiltration of the liver. 4. Small esophageal hiatal hernia. 5. Aortic atherosclerosis. 6. Small right inguinal hernia containing fat. Electronically Signed   By: Lucienne Capers M.D.   On: 04/08/2017 23:41    Microbiology: No results found for this or any previous visit (from the past 240 hour(s)).   Labs: Basic Metabolic Panel: Recent Labs  Lab 04/08/17 1925 04/09/17 0656 04/10/17 0945  NA 138 136 136  K 3.0* 3.4* 3.7  CL 102 104 105  CO2 22 23 22   GLUCOSE 130* 100* 119*  BUN <5* <5* <5*  CREATININE 0.67 0.69 0.71  CALCIUM 8.8* 8.0* 8.8*   Liver Function Tests: Recent Labs  Lab 04/08/17 1930  AST 33  ALT 23  ALKPHOS 79  BILITOT 0.9  PROT 7.2  ALBUMIN 3.9   Recent Labs  Lab 04/08/17 1930  LIPASE 28   No results for input(s): AMMONIA in the last 168 hours. CBC: Recent Labs  Lab 04/08/17 1925 04/09/17 0656 04/10/17 0945  WBC 8.6 8.1 4.9  HGB 15.3 13.1 14.2  HCT 43.6 38.3* 41.6  MCV 100.9* 102.7* 103.7*  PLT 209 185 180   Cardiac Enzymes: Recent Labs  Lab 04/09/17 0204 04/09/17 0656 04/09/17 1111  TROPONINI <0.03 <0.03 <0.03   BNP: BNP (last 3 results) No results for input(s): BNP in the last 8760 hours.  ProBNP (last 3 results) No results for input(s): PROBNP in the last 8760 hours.  CBG: No results for input(s): GLUCAP in the last 168 hours.     Signed:  Velvet Bathe MD.  [facility] 04/15/2017, 3:33 PM

## 2017-05-12 ENCOUNTER — Emergency Department (HOSPITAL_COMMUNITY): Payer: 59

## 2017-05-12 ENCOUNTER — Encounter (HOSPITAL_COMMUNITY): Payer: Self-pay | Admitting: Internal Medicine

## 2017-05-12 ENCOUNTER — Emergency Department (HOSPITAL_COMMUNITY)
Admission: EM | Admit: 2017-05-12 | Discharge: 2017-05-12 | Disposition: A | Discharge status: Home / Self Care | Payer: 59 | Attending: Emergency Medicine | Admitting: Emergency Medicine

## 2017-05-12 DIAGNOSIS — F17210 Nicotine dependence, cigarettes, uncomplicated: Secondary | ICD-10-CM | POA: Insufficient documentation

## 2017-05-12 DIAGNOSIS — R1084 Generalized abdominal pain: Secondary | ICD-10-CM

## 2017-05-12 DIAGNOSIS — J449 Chronic obstructive pulmonary disease, unspecified: Secondary | ICD-10-CM | POA: Insufficient documentation

## 2017-05-12 DIAGNOSIS — R1010 Upper abdominal pain, unspecified: Secondary | ICD-10-CM | POA: Diagnosis present

## 2017-05-12 DIAGNOSIS — J45909 Unspecified asthma, uncomplicated: Secondary | ICD-10-CM | POA: Diagnosis not present

## 2017-05-12 DIAGNOSIS — Z79899 Other long term (current) drug therapy: Secondary | ICD-10-CM | POA: Diagnosis not present

## 2017-05-12 LAB — URINALYSIS, ROUTINE W REFLEX MICROSCOPIC
BILIRUBIN URINE: NEGATIVE
Glucose, UA: NEGATIVE mg/dL
HGB URINE DIPSTICK: NEGATIVE
Ketones, ur: 5 mg/dL — AB
Leukocytes, UA: NEGATIVE
NITRITE: NEGATIVE
PH: 5 (ref 5.0–8.0)
Protein, ur: NEGATIVE mg/dL

## 2017-05-12 LAB — COMPREHENSIVE METABOLIC PANEL
ALBUMIN: 4.1 g/dL (ref 3.5–5.0)
ALT: 22 U/L (ref 17–63)
ANION GAP: 9 (ref 5–15)
AST: 40 U/L (ref 15–41)
Alkaline Phosphatase: 73 U/L (ref 38–126)
BUN: 6 mg/dL (ref 6–20)
CHLORIDE: 103 mmol/L (ref 101–111)
CO2: 23 mmol/L (ref 22–32)
Calcium: 9.2 mg/dL (ref 8.9–10.3)
Creatinine, Ser: 0.63 mg/dL (ref 0.61–1.24)
GFR calc Af Amer: >60 mL/min (ref >60)
GFR calc non Af Amer: >60 mL/min (ref >60)
GLUCOSE: 133 mg/dL — AB (ref 65–99)
POTASSIUM: 4.1 mmol/L (ref 3.5–5.1)
SODIUM: 135 mmol/L (ref 135–145)
Total Bilirubin: 1.2 mg/dL (ref 0.3–1.2)
Total Protein: 7.8 g/dL (ref 6.5–8.1)

## 2017-05-12 LAB — CBC WITH DIFFERENTIAL/PLATELET
Basophils Absolute: 0 10*3/uL (ref 0.0–0.1)
Basophils Relative: 0 %
Eosinophils Absolute: 0 10*3/uL (ref 0.0–0.7)
Eosinophils Relative: 0 %
HEMATOCRIT: 39.3 % (ref 39.0–52.0)
HEMOGLOBIN: 13.8 g/dL (ref 13.0–17.0)
LYMPHS ABS: 0.9 10*3/uL (ref 0.7–4.0)
Lymphocytes Relative: 13 %
MCH: 36.2 pg — AB (ref 26.0–34.0)
MCHC: 35.1 g/dL (ref 30.0–36.0)
MCV: 103.1 fL — ABNORMAL HIGH (ref 78.0–100.0)
MONOS PCT: 2 %
Monocytes Absolute: 0.2 10*3/uL (ref 0.1–1.0)
NEUTROS ABS: 5.7 10*3/uL (ref 1.7–7.7)
NEUTROS PCT: 85 %
Platelets: 219 10*3/uL (ref 150–400)
RBC: 3.81 MIL/uL — AB (ref 4.22–5.81)
RDW: 13.5 % (ref 11.5–15.5)
WBC: 6.8 10*3/uL (ref 4.0–10.5)

## 2017-05-12 LAB — LIPASE, BLOOD: Lipase: 30 U/L (ref 11–51)

## 2017-05-12 MED ORDER — HYDROMORPHONE HCL 1 MG/ML IJ SOLN
1.0000 mg | Freq: Once | INTRAMUSCULAR | Status: AC
  Administered 2017-05-12: 1 mg via INTRAVENOUS
  Filled 2017-05-12: qty 1

## 2017-05-12 MED ORDER — IOPAMIDOL (ISOVUE-300) INJECTION 61%
100.0000 mL | Freq: Once | INTRAVENOUS | Status: AC | PRN
  Administered 2017-05-12: 100 mL via INTRAVENOUS

## 2017-05-12 MED ORDER — SODIUM CHLORIDE 0.9 % IV BOLUS (SEPSIS)
1000.0000 mL | Freq: Once | INTRAVENOUS | Status: AC
  Administered 2017-05-12: 1000 mL via INTRAVENOUS

## 2017-05-12 MED ORDER — SODIUM CHLORIDE 0.9 % IV SOLN
INTRAVENOUS | Status: DC
  Administered 2017-05-12: 10:00:00 via INTRAVENOUS

## 2017-05-12 MED ORDER — IOPAMIDOL (ISOVUE-300) INJECTION 61%
INTRAVENOUS | Status: AC
  Filled 2017-05-12: qty 100

## 2017-05-12 MED ORDER — ONDANSETRON HCL 4 MG/2ML IJ SOLN
4.0000 mg | Freq: Once | INTRAMUSCULAR | Status: AC
  Administered 2017-05-12: 4 mg via INTRAVENOUS
  Filled 2017-05-12: qty 2

## 2017-05-12 MED ORDER — HYDROCODONE-ACETAMINOPHEN 5-325 MG PO TABS: 1.0000 | ORAL_TABLET | Freq: Four times a day (QID) | ORAL | 0 refills | Status: DC | PRN

## 2017-05-12 MED ORDER — ONDANSETRON 4 MG PO TBDP: 4.0000 mg | ORAL_TABLET | Freq: Three times a day (TID) | ORAL | 0 refills | Status: DC | PRN

## 2017-05-12 NOTE — ED Notes (Addendum)
Patient transported to XR. 

## 2017-05-12 NOTE — ED Notes (Signed)
Patient asleep and resting comfortably.  

## 2017-05-12 NOTE — Discharge Instructions (Signed)
As discussed, your evaluation today has been largely reassuring.  But, it is important that you monitor your condition carefully, and do not hesitate to return to the ED if you develop new, or concerning changes in your condition. ? ?Otherwise, please follow-up with your physician for appropriate ongoing care. ? ?

## 2017-05-12 NOTE — ED Triage Notes (Addendum)
Pt arrived to [facility] via [place] c/o abdominal pain. Pt was recently hospitalized for diverticulitis and reports the same pain today. Pt is also complaining of nausea, vomiting, and diarrhea since last night. He reports many emesis episodes and 4 episodes of diarrhea since last night. Pt given 4mg  zofran IV at 0930.

## 2017-05-12 NOTE — ED Notes (Signed)
Pt has an urinal and is aware of need for urine specimen

## 2017-05-12 NOTE — ED Notes (Signed)
Pt reminded of need for urine specimen states he can not provide one at this time
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
## 2017-05-12 NOTE — ED Notes (Signed)
Tech made this RN aware that patient requesting more pain medication. Will make Dr. Vanita Panda aware.

## 2017-05-12 NOTE — ED Notes (Signed)
Bed: NU27 Expected date:  Expected time:  Means of arrival:  Comments: EMS-abdominal pain

## 2017-05-12 NOTE — ED Notes (Signed)
Patient transported to CT 

## 2017-05-12 NOTE — ED Notes (Signed)
ED Provider at bedside. 

## 2017-05-13 ENCOUNTER — Encounter (HOSPITAL_COMMUNITY): Payer: Self-pay

## 2017-05-13 ENCOUNTER — Emergency Department (HOSPITAL_COMMUNITY)
Admission: EM | Admit: 2017-05-13 | Discharge: 2017-05-13 | Disposition: A | Discharge status: Home / Self Care | Payer: 59 | Attending: Emergency Medicine | Admitting: Emergency Medicine

## 2017-05-13 ENCOUNTER — Emergency Department (HOSPITAL_COMMUNITY): Payer: 59

## 2017-05-13 ENCOUNTER — Other Ambulatory Visit: Payer: Self-pay

## 2017-05-13 DIAGNOSIS — Z79899 Other long term (current) drug therapy: Secondary | ICD-10-CM | POA: Insufficient documentation

## 2017-05-13 DIAGNOSIS — R1031 Right lower quadrant pain: Secondary | ICD-10-CM

## 2017-05-13 DIAGNOSIS — K4090 Unilateral inguinal hernia, without obstruction or gangrene, not specified as recurrent: Secondary | ICD-10-CM | POA: Insufficient documentation

## 2017-05-13 DIAGNOSIS — R109 Unspecified abdominal pain: Secondary | ICD-10-CM | POA: Diagnosis present

## 2017-05-13 DIAGNOSIS — F17210 Nicotine dependence, cigarettes, uncomplicated: Secondary | ICD-10-CM | POA: Insufficient documentation

## 2017-05-13 DIAGNOSIS — J45909 Unspecified asthma, uncomplicated: Secondary | ICD-10-CM | POA: Diagnosis not present

## 2017-05-13 DIAGNOSIS — J449 Chronic obstructive pulmonary disease, unspecified: Secondary | ICD-10-CM | POA: Diagnosis not present

## 2017-05-13 LAB — COMPREHENSIVE METABOLIC PANEL
ALK PHOS: 65 U/L (ref 38–126)
ALT: 24 U/L (ref 17–63)
ANION GAP: 11 (ref 5–15)
AST: 33 U/L (ref 15–41)
Albumin: 3.8 g/dL (ref 3.5–5.0)
BILIRUBIN TOTAL: 1.4 mg/dL — AB (ref 0.3–1.2)
BUN: 10 mg/dL (ref 6–20)
CALCIUM: 8.7 mg/dL — AB (ref 8.9–10.3)
CO2: 22 mmol/L (ref 22–32)
Chloride: 101 mmol/L (ref 101–111)
Creatinine, Ser: 0.63 mg/dL (ref 0.61–1.24)
GFR calc non Af Amer: >60 mL/min (ref >60)
Glucose, Bld: 120 mg/dL — ABNORMAL HIGH (ref 65–99)
Potassium: 3.6 mmol/L (ref 3.5–5.1)
Sodium: 134 mmol/L — ABNORMAL LOW (ref 135–145)
TOTAL PROTEIN: 7 g/dL (ref 6.5–8.1)

## 2017-05-13 LAB — URINALYSIS, ROUTINE W REFLEX MICROSCOPIC
BILIRUBIN URINE: NEGATIVE
Glucose, UA: NEGATIVE mg/dL
Hgb urine dipstick: NEGATIVE
KETONES UR: NEGATIVE mg/dL
Leukocytes, UA: NEGATIVE
NITRITE: NEGATIVE
PROTEIN: NEGATIVE mg/dL
SPECIFIC GRAVITY, URINE: 1.002 — AB (ref 1.005–1.030)
pH: 6 (ref 5.0–8.0)

## 2017-05-13 LAB — CBC WITH DIFFERENTIAL/PLATELET
BASOS PCT: 0 %
Basophils Absolute: 0 10*3/uL (ref 0.0–0.1)
EOS ABS: 0 10*3/uL (ref 0.0–0.7)
EOS PCT: 0 %
HCT: 38.1 % — ABNORMAL LOW (ref 39.0–52.0)
HEMOGLOBIN: 13.3 g/dL (ref 13.0–17.0)
Lymphocytes Relative: 24 %
Lymphs Abs: 1.8 10*3/uL (ref 0.7–4.0)
MCH: 35.7 pg — ABNORMAL HIGH (ref 26.0–34.0)
MCHC: 34.9 g/dL (ref 30.0–36.0)
MCV: 102.1 fL — ABNORMAL HIGH (ref 78.0–100.0)
MONOS PCT: 11 %
Monocytes Absolute: 0.9 10*3/uL (ref 0.1–1.0)
NEUTROS PCT: 65 %
Neutro Abs: 4.9 10*3/uL (ref 1.7–7.7)
PLATELETS: 174 10*3/uL (ref 150–400)
RBC: 3.73 MIL/uL — AB (ref 4.22–5.81)
RDW: 13.3 % (ref 11.5–15.5)
WBC: 7.6 10*3/uL (ref 4.0–10.5)

## 2017-05-13 LAB — LIPASE, BLOOD: Lipase: 32 U/L (ref 11–51)

## 2017-05-13 MED ORDER — SODIUM CHLORIDE 0.9 % IV SOLN
INTRAVENOUS | Status: DC
  Administered 2017-05-13: 10:00:00 via INTRAVENOUS

## 2017-05-13 MED ORDER — HYDROMORPHONE HCL 2 MG PO TABS: 1.0000 mg | ORAL_TABLET | Freq: Four times a day (QID) | ORAL | 0 refills | Status: DC | PRN

## 2017-05-13 MED ORDER — HYDROMORPHONE HCL 1 MG/ML IJ SOLN
1.0000 mg | Freq: Once | INTRAMUSCULAR | Status: AC
  Administered 2017-05-13: 1 mg via INTRAVENOUS
  Filled 2017-05-13: qty 1

## 2017-05-13 MED ORDER — ONDANSETRON HCL 4 MG/2ML IJ SOLN
4.0000 mg | Freq: Once | INTRAMUSCULAR | Status: AC
  Administered 2017-05-13: 4 mg via INTRAVENOUS
  Filled 2017-05-13: qty 2

## 2017-05-13 NOTE — Discharge Instructions (Signed)
As discussed, with today's and yesterday's evaluation, it is important he continue to have her care managed by her primary care physician, and keep her appointment with her gastroenterologist. In addition, please schedule a follow-up appointment with our surgical colleagues for evaluation of your hernia.  Return here for concerning changes in your condition.

## 2017-05-13 NOTE — ED Notes (Signed)
Bed: WA04 Expected date:  Expected time:  Means of arrival:  Comments: EMS-fall/leg deformity
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
## 2017-05-13 NOTE — ED Triage Notes (Signed)
Patient presented to ed via ems with c/o abdominal pain. Pt have hx of diverticulitis and was seen yesterday for the same thing. Pt given Zofran 4 mg po by ems.

## 2017-05-14 ENCOUNTER — Emergency Department (HOSPITAL_COMMUNITY)
Admission: EM | Admit: 2017-05-14 | Discharge: 2017-05-14 | Disposition: A | Discharge status: Home / Self Care | Payer: 59 | Attending: Emergency Medicine | Admitting: Emergency Medicine

## 2017-05-14 ENCOUNTER — Other Ambulatory Visit: Payer: Self-pay

## 2017-05-14 ENCOUNTER — Encounter (HOSPITAL_COMMUNITY): Payer: Self-pay

## 2017-05-14 DIAGNOSIS — R112 Nausea with vomiting, unspecified: Secondary | ICD-10-CM | POA: Insufficient documentation

## 2017-05-14 DIAGNOSIS — R079 Chest pain, unspecified: Secondary | ICD-10-CM | POA: Diagnosis present

## 2017-05-14 DIAGNOSIS — Z5321 Procedure and treatment not carried out due to patient leaving prior to being seen by health care provider: Secondary | ICD-10-CM | POA: Diagnosis not present

## 2017-05-14 LAB — CBC
HCT: 41 % (ref 39.0–52.0)
Hemoglobin: 14.4 g/dL (ref 13.0–17.0)
MCH: 35.5 pg — ABNORMAL HIGH (ref 26.0–34.0)
MCHC: 35.1 g/dL (ref 30.0–36.0)
MCV: 101 fL — AB (ref 78.0–100.0)
PLATELETS: 217 10*3/uL (ref 150–400)
RBC: 4.06 MIL/uL — ABNORMAL LOW (ref 4.22–5.81)
RDW: 12.7 % (ref 11.5–15.5)
WBC: 8.4 10*3/uL (ref 4.0–10.5)

## 2017-05-14 LAB — BASIC METABOLIC PANEL
Anion gap: 10 (ref 5–15)
BUN: 5 mg/dL — ABNORMAL LOW (ref 6–20)
CALCIUM: 8.7 mg/dL — AB (ref 8.9–10.3)
CHLORIDE: 99 mmol/L — AB (ref 101–111)
CO2: 24 mmol/L (ref 22–32)
CREATININE: 0.66 mg/dL (ref 0.61–1.24)
Glucose, Bld: 111 mg/dL — ABNORMAL HIGH (ref 65–99)
Potassium: 3.2 mmol/L — ABNORMAL LOW (ref 3.5–5.1)
SODIUM: 133 mmol/L — AB (ref 135–145)

## 2017-05-14 LAB — HEPATIC FUNCTION PANEL
ALK PHOS: 71 U/L (ref 38–126)
ALT: 26 U/L (ref 17–63)
AST: 37 U/L (ref 15–41)
Albumin: 3.8 g/dL (ref 3.5–5.0)
BILIRUBIN INDIRECT: 0.8 mg/dL (ref 0.3–0.9)
Bilirubin, Direct: 0.2 mg/dL (ref 0.1–0.5)
TOTAL PROTEIN: 7.2 g/dL (ref 6.5–8.1)
Total Bilirubin: 1 mg/dL (ref 0.3–1.2)

## 2017-05-14 LAB — I-STAT TROPONIN, ED: TROPONIN I, POC: 0 ng/mL (ref 0.00–0.08)

## 2017-05-14 LAB — LIPASE, BLOOD: LIPASE: 38 U/L (ref 11–51)

## 2017-05-14 NOTE — ED Notes (Signed)
Called 3x, no response.  

## 2017-05-14 NOTE — ED Notes (Signed)
Patient not answering - has been called multiple times.

## 2017-05-14 NOTE — ED Triage Notes (Signed)
PT presents to ED with complaints of chest pain that he believes is related to constant nausea and vomiting. Pain is reproducible and worse when vomiting. PT states he has been seen 2 the last 2 days for vomiting and abdominal pain. Pain is worse today.

## 2017-05-16 ENCOUNTER — Encounter (HOSPITAL_COMMUNITY): Payer: Self-pay | Admitting: *Deleted

## 2017-05-16 ENCOUNTER — Emergency Department (HOSPITAL_COMMUNITY)
Admission: EM | Admit: 2017-05-16 | Discharge: 2017-05-17 | Disposition: A | Discharge status: Home / Self Care | Payer: 59 | Attending: Emergency Medicine | Admitting: Emergency Medicine

## 2017-05-16 ENCOUNTER — Emergency Department (HOSPITAL_COMMUNITY): Payer: 59

## 2017-05-16 DIAGNOSIS — R112 Nausea with vomiting, unspecified: Secondary | ICD-10-CM

## 2017-05-16 DIAGNOSIS — J449 Chronic obstructive pulmonary disease, unspecified: Secondary | ICD-10-CM | POA: Insufficient documentation

## 2017-05-16 DIAGNOSIS — J45909 Unspecified asthma, uncomplicated: Secondary | ICD-10-CM | POA: Diagnosis not present

## 2017-05-16 DIAGNOSIS — E876 Hypokalemia: Secondary | ICD-10-CM | POA: Diagnosis not present

## 2017-05-16 DIAGNOSIS — Z79899 Other long term (current) drug therapy: Secondary | ICD-10-CM | POA: Diagnosis not present

## 2017-05-16 DIAGNOSIS — R1084 Generalized abdominal pain: Secondary | ICD-10-CM | POA: Diagnosis not present

## 2017-05-16 DIAGNOSIS — F17210 Nicotine dependence, cigarettes, uncomplicated: Secondary | ICD-10-CM | POA: Diagnosis not present

## 2017-05-16 LAB — COMPREHENSIVE METABOLIC PANEL
ALT: 20 U/L (ref 17–63)
AST: 21 U/L (ref 15–41)
Albumin: 3.8 g/dL (ref 3.5–5.0)
Alkaline Phosphatase: 64 U/L (ref 38–126)
Anion gap: 9 (ref 5–15)
BUN: <5 mg/dL — ABNORMAL LOW (ref 6–20)
CHLORIDE: 100 mmol/L — AB (ref 101–111)
CO2: 25 mmol/L (ref 22–32)
Calcium: 8.6 mg/dL — ABNORMAL LOW (ref 8.9–10.3)
Creatinine, Ser: 0.49 mg/dL — ABNORMAL LOW (ref 0.61–1.24)
Glucose, Bld: 113 mg/dL — ABNORMAL HIGH (ref 65–99)
POTASSIUM: 3.3 mmol/L — AB (ref 3.5–5.1)
SODIUM: 134 mmol/L — AB (ref 135–145)
Total Bilirubin: 0.8 mg/dL (ref 0.3–1.2)
Total Protein: 7 g/dL (ref 6.5–8.1)

## 2017-05-16 LAB — CBC
HEMATOCRIT: 38.5 % — AB (ref 39.0–52.0)
Hemoglobin: 13.8 g/dL (ref 13.0–17.0)
MCH: 36.3 pg — ABNORMAL HIGH (ref 26.0–34.0)
MCHC: 35.8 g/dL (ref 30.0–36.0)
MCV: 101.3 fL — AB (ref 78.0–100.0)
Platelets: 203 10*3/uL (ref 150–400)
RBC: 3.8 MIL/uL — AB (ref 4.22–5.81)
RDW: 13 % (ref 11.5–15.5)
WBC: 8.7 10*3/uL (ref 4.0–10.5)

## 2017-05-16 LAB — URINALYSIS, ROUTINE W REFLEX MICROSCOPIC
Bilirubin Urine: NEGATIVE
GLUCOSE, UA: NEGATIVE mg/dL
Hgb urine dipstick: NEGATIVE
Ketones, ur: NEGATIVE mg/dL
LEUKOCYTES UA: NEGATIVE
Nitrite: NEGATIVE
PH: 7 (ref 5.0–8.0)
Protein, ur: NEGATIVE mg/dL
SPECIFIC GRAVITY, URINE: 1.002 — AB (ref 1.005–1.030)

## 2017-05-16 LAB — LIPASE, BLOOD: LIPASE: 32 U/L (ref 11–51)

## 2017-05-16 MED ORDER — SODIUM CHLORIDE 0.9 % IV BOLUS (SEPSIS)
1000.0000 mL | Freq: Once | INTRAVENOUS | Status: AC
  Administered 2017-05-16: 1000 mL via INTRAVENOUS

## 2017-05-16 MED ORDER — GI COCKTAIL ~~LOC~~
30.0000 mL | Freq: Once | ORAL | Status: AC
  Administered 2017-05-16: 30 mL via ORAL
  Filled 2017-05-16: qty 30

## 2017-05-16 MED ORDER — RANITIDINE HCL 150 MG PO TABS: 150.0000 mg | ORAL_TABLET | Freq: Two times a day (BID) | ORAL | 0 refills | Status: DC

## 2017-05-16 MED ORDER — FAMOTIDINE IN NACL 20-0.9 MG/50ML-% IV SOLN
20.0000 mg | Freq: Once | INTRAVENOUS | Status: AC
  Administered 2017-05-16: 20 mg via INTRAVENOUS
  Filled 2017-05-16: qty 50

## 2017-05-16 MED ORDER — POTASSIUM CHLORIDE CRYS ER 20 MEQ PO TBCR
40.0000 meq | EXTENDED_RELEASE_TABLET | Freq: Once | ORAL | Status: AC
  Administered 2017-05-16: 40 meq via ORAL
  Filled 2017-05-16: qty 2

## 2017-05-16 MED ORDER — ONDANSETRON HCL 4 MG/2ML IJ SOLN
4.0000 mg | Freq: Once | INTRAMUSCULAR | Status: AC
  Administered 2017-05-16: 4 mg via INTRAVENOUS
  Filled 2017-05-16: qty 2

## 2017-05-16 MED ORDER — LORAZEPAM 2 MG/ML IJ SOLN
1.0000 mg | Freq: Once | INTRAMUSCULAR | Status: AC
  Administered 2017-05-16: 1 mg via INTRAVENOUS
  Filled 2017-05-16: qty 1

## 2017-05-16 NOTE — ED Provider Notes (Signed)
11:38 PM Patient care assumed from [facility], PA-C at change of shift.  Plan discussed with Street, PA-C which includes discharge if urinalysis negative for UTI.  UA findings reviewed, negative for bacteriuria and pyuria.  In light of these findings, patient to be discharged with supportive management.  Return precautions provided.  Patient discharged in stable condition.   Antonietta Breach, PA-C 05/16/17 2339    Fatima Blank, MD 05/17/17 [phone]

## 2017-05-16 NOTE — ED Triage Notes (Signed)
Pt complains of abdominal pain, nausea, emesis x 4 days. Pt denies diarrhea.

## 2017-05-16 NOTE — ED Notes (Signed)
Pt aware of the need for a urine specimen.  Urinal is at his bedside for him to use when able.

## 2017-05-16 NOTE — Discharge Instructions (Signed)
Your abdominal pain could be from a variety of reasons, however your work up today has been reassuring, aside from a mildly low potassium level, for which you were given potassium here. You will need to take zantac as directed, and avoid spicy/fatty/acidic foods, avoid soda/coffee/tea/alcohol. Avoid laying down flat within 30 minutes of eating. Avoid NSAIDs like ibuprofen/aleve/motrin/etc on an empty stomach. May consider using over the counter tums/maalox as needed for additional relief. Use home zofran as directed as needed for nausea. Use tylenol as needed for pain. Follow up with your regular doctor in 5-7 days for recheck of symptoms and ongoing management of your chronic abdominal pain. Return to the ER for changes or worsening symptoms.
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
## 2017-05-29 ENCOUNTER — Emergency Department (HOSPITAL_COMMUNITY)
Admission: EM | Admit: 2017-05-29 | Discharge: 2017-05-30 | Disposition: A | Discharge status: Home / Self Care | Payer: 59 | Attending: Emergency Medicine | Admitting: Emergency Medicine

## 2017-05-29 ENCOUNTER — Encounter (HOSPITAL_COMMUNITY): Payer: Self-pay

## 2017-05-29 ENCOUNTER — Emergency Department (HOSPITAL_COMMUNITY): Payer: 59

## 2017-05-29 DIAGNOSIS — R112 Nausea with vomiting, unspecified: Secondary | ICD-10-CM | POA: Insufficient documentation

## 2017-05-29 DIAGNOSIS — R1084 Generalized abdominal pain: Secondary | ICD-10-CM

## 2017-05-29 DIAGNOSIS — R10814 Left lower quadrant abdominal tenderness: Secondary | ICD-10-CM | POA: Diagnosis not present

## 2017-05-29 DIAGNOSIS — Z79899 Other long term (current) drug therapy: Secondary | ICD-10-CM | POA: Diagnosis not present

## 2017-05-29 DIAGNOSIS — F17210 Nicotine dependence, cigarettes, uncomplicated: Secondary | ICD-10-CM | POA: Diagnosis not present

## 2017-05-29 DIAGNOSIS — J45909 Unspecified asthma, uncomplicated: Secondary | ICD-10-CM | POA: Insufficient documentation

## 2017-05-29 DIAGNOSIS — R10813 Right lower quadrant abdominal tenderness: Secondary | ICD-10-CM | POA: Diagnosis not present

## 2017-05-29 DIAGNOSIS — K5901 Slow transit constipation: Secondary | ICD-10-CM | POA: Diagnosis not present

## 2017-05-29 DIAGNOSIS — J449 Chronic obstructive pulmonary disease, unspecified: Secondary | ICD-10-CM | POA: Diagnosis not present

## 2017-05-29 DIAGNOSIS — Z880 Allergy status to penicillin: Secondary | ICD-10-CM | POA: Diagnosis not present

## 2017-05-29 DIAGNOSIS — R109 Unspecified abdominal pain: Secondary | ICD-10-CM | POA: Diagnosis not present

## 2017-05-29 LAB — CBC
HCT: 44.5 % (ref 39.0–52.0)
HEMOGLOBIN: 15.4 g/dL (ref 13.0–17.0)
MCH: 36 pg — AB (ref 26.0–34.0)
MCHC: 34.6 g/dL (ref 30.0–36.0)
MCV: 104 fL — AB (ref 78.0–100.0)
PLATELETS: 208 10*3/uL (ref 150–400)
RBC: 4.28 MIL/uL (ref 4.22–5.81)
RDW: 13.1 % (ref 11.5–15.5)
WBC: 10.2 10*3/uL (ref 4.0–10.5)

## 2017-05-29 LAB — COMPREHENSIVE METABOLIC PANEL
ALBUMIN: 4.2 g/dL (ref 3.5–5.0)
ALK PHOS: 82 U/L (ref 38–126)
ALT: 25 U/L (ref 17–63)
AST: 27 U/L (ref 15–41)
Anion gap: 10 (ref 5–15)
BUN: 7 mg/dL (ref 6–20)
CALCIUM: 9.5 mg/dL (ref 8.9–10.3)
CHLORIDE: 102 mmol/L (ref 101–111)
CO2: 24 mmol/L (ref 22–32)
CREATININE: 0.63 mg/dL (ref 0.61–1.24)
GFR calc non Af Amer: >60 mL/min (ref >60)
GLUCOSE: 137 mg/dL — AB (ref 65–99)
Potassium: 3.5 mmol/L (ref 3.5–5.1)
SODIUM: 136 mmol/L (ref 135–145)
Total Bilirubin: 0.9 mg/dL (ref 0.3–1.2)
Total Protein: 7.9 g/dL (ref 6.5–8.1)

## 2017-05-29 LAB — I-STAT TROPONIN, ED: TROPONIN I, POC: 0 ng/mL (ref 0.00–0.08)

## 2017-05-29 LAB — LIPASE, BLOOD: LIPASE: 40 U/L (ref 11–51)

## 2017-05-29 MED ORDER — HYDROMORPHONE HCL 1 MG/ML IJ SOLN
1.0000 mg | Freq: Once | INTRAMUSCULAR | Status: AC
  Administered 2017-05-29: 1 mg via INTRAVENOUS
  Filled 2017-05-29: qty 1

## 2017-05-29 MED ORDER — HYDROMORPHONE HCL 1 MG/ML IJ SOLN
1.0000 mg | Freq: Once | INTRAMUSCULAR | Status: DC
  Filled 2017-05-29: qty 1

## 2017-05-29 MED ORDER — LACTULOSE 10 GM/15ML PO SOLN: 20.0000 g | Freq: Two times a day (BID) | ORAL | 0 refills | Status: DC | PRN

## 2017-05-29 MED ORDER — LORAZEPAM 2 MG/ML IJ SOLN
1.0000 mg | Freq: Once | INTRAMUSCULAR | Status: AC
  Administered 2017-05-29: 1 mg via INTRAVENOUS
  Filled 2017-05-29: qty 1

## 2017-05-29 MED ORDER — SODIUM CHLORIDE 0.9 % IV BOLUS (SEPSIS)
1000.0000 mL | Freq: Once | INTRAVENOUS | Status: AC
  Administered 2017-05-29: 1000 mL via INTRAVENOUS

## 2017-05-29 MED ORDER — ONDANSETRON HCL 4 MG/2ML IJ SOLN
4.0000 mg | Freq: Once | INTRAMUSCULAR | Status: AC
  Administered 2017-05-29: 4 mg via INTRAVENOUS
  Filled 2017-05-29: qty 2

## 2017-05-29 MED ORDER — SORBITOL 70 % SOLN
960.0000 mL | TOPICAL_OIL | Freq: Once | ORAL | Status: AC
  Administered 2017-05-29: 960 mL via RECTAL
  Filled 2017-05-29: qty 473

## 2017-05-29 MED ORDER — DICYCLOMINE HCL 20 MG PO TABS: 20.0000 mg | ORAL_TABLET | Freq: Two times a day (BID) | ORAL | 0 refills | Status: DC

## 2017-05-29 MED ORDER — ONDANSETRON HCL 4 MG PO TABS: 4.0000 mg | ORAL_TABLET | Freq: Four times a day (QID) | ORAL | 0 refills | Status: DC
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
## 2017-05-29 NOTE — ED Notes (Signed)
Patient transported to X-ray 

## 2017-05-29 NOTE — ED Triage Notes (Signed)
Pt coming from home by [facility]. Pt endorses constipation for 2 weeks and hx of diverticulitis. Pt was vomiting at home and that's when chest pain started with centralize burning sensation that radiates to central back. Pt was given zofran en route. Pt was not give asa due to allergies. Pt is axo x4. Pt pain level is a 10/10 at this time.

## 2017-05-30 MED ORDER — HYDROMORPHONE HCL 1 MG/ML IJ SOLN
1.0000 mg | Freq: Once | INTRAMUSCULAR | Status: AC
  Administered 2017-05-30: 1 mg via INTRAMUSCULAR

## 2017-06-02 ENCOUNTER — Encounter (HOSPITAL_COMMUNITY): Payer: Self-pay | Admitting: Emergency Medicine

## 2017-06-02 ENCOUNTER — Observation Stay (HOSPITAL_COMMUNITY)
Admission: EM | Admit: 2017-06-02 | Discharge: 2017-06-06 | DRG: 391 | Disposition: A | Discharge status: Home / Self Care | Payer: 59 | Attending: Internal Medicine | Admitting: Internal Medicine

## 2017-06-02 ENCOUNTER — Emergency Department (HOSPITAL_COMMUNITY): Payer: 59

## 2017-06-02 ENCOUNTER — Other Ambulatory Visit: Payer: Self-pay

## 2017-06-02 DIAGNOSIS — K529 Noninfective gastroenteritis and colitis, unspecified: Secondary | ICD-10-CM | POA: Diagnosis not present

## 2017-06-02 DIAGNOSIS — J189 Pneumonia, unspecified organism: Secondary | ICD-10-CM | POA: Diagnosis not present

## 2017-06-02 DIAGNOSIS — F17210 Nicotine dependence, cigarettes, uncomplicated: Secondary | ICD-10-CM | POA: Diagnosis present

## 2017-06-02 DIAGNOSIS — R1084 Generalized abdominal pain: Secondary | ICD-10-CM

## 2017-06-02 DIAGNOSIS — R748 Abnormal levels of other serum enzymes: Secondary | ICD-10-CM | POA: Diagnosis not present

## 2017-06-02 DIAGNOSIS — R112 Nausea with vomiting, unspecified: Secondary | ICD-10-CM

## 2017-06-02 DIAGNOSIS — Z720 Tobacco use: Secondary | ICD-10-CM | POA: Diagnosis present

## 2017-06-02 DIAGNOSIS — R197 Diarrhea, unspecified: Secondary | ICD-10-CM | POA: Diagnosis present

## 2017-06-02 DIAGNOSIS — R109 Unspecified abdominal pain: Secondary | ICD-10-CM | POA: Diagnosis present

## 2017-06-02 DIAGNOSIS — K4090 Unilateral inguinal hernia, without obstruction or gangrene, not specified as recurrent: Secondary | ICD-10-CM | POA: Diagnosis not present

## 2017-06-02 DIAGNOSIS — E876 Hypokalemia: Secondary | ICD-10-CM | POA: Diagnosis present

## 2017-06-02 DIAGNOSIS — J440 Chronic obstructive pulmonary disease with acute lower respiratory infection: Secondary | ICD-10-CM | POA: Diagnosis present

## 2017-06-02 DIAGNOSIS — J449 Chronic obstructive pulmonary disease, unspecified: Secondary | ICD-10-CM | POA: Diagnosis present

## 2017-06-02 HISTORY — DX: Pneumonia, unspecified organism: J18.9

## 2017-06-02 LAB — I-STAT TROPONIN, ED: TROPONIN I, POC: 0.01 ng/mL (ref 0.00–0.08)

## 2017-06-02 LAB — COMPREHENSIVE METABOLIC PANEL
ALK PHOS: 80 U/L (ref 38–126)
ALT: 27 U/L (ref 17–63)
ANION GAP: 10 (ref 5–15)
AST: 30 U/L (ref 15–41)
Albumin: 4.1 g/dL (ref 3.5–5.0)
BILIRUBIN TOTAL: 1.5 mg/dL — AB (ref 0.3–1.2)
BUN: 6 mg/dL (ref 6–20)
CALCIUM: 9.2 mg/dL (ref 8.9–10.3)
CO2: 25 mmol/L (ref 22–32)
Chloride: 95 mmol/L — ABNORMAL LOW (ref 101–111)
Creatinine, Ser: 0.55 mg/dL — ABNORMAL LOW (ref 0.61–1.24)
GFR calc non Af Amer: >60 mL/min (ref >60)
GLUCOSE: 118 mg/dL — AB (ref 65–99)
Potassium: 3.1 mmol/L — ABNORMAL LOW (ref 3.5–5.1)
Sodium: 130 mmol/L — ABNORMAL LOW (ref 135–145)
TOTAL PROTEIN: 7.7 g/dL (ref 6.5–8.1)

## 2017-06-02 LAB — CBC
HCT: 41.5 % (ref 39.0–52.0)
HEMOGLOBIN: 14.9 g/dL (ref 13.0–17.0)
MCH: 36 pg — ABNORMAL HIGH (ref 26.0–34.0)
MCHC: 35.9 g/dL (ref 30.0–36.0)
MCV: 100.2 fL — ABNORMAL HIGH (ref 78.0–100.0)
Platelets: 236 10*3/uL (ref 150–400)
RBC: 4.14 MIL/uL — AB (ref 4.22–5.81)
RDW: 12.4 % (ref 11.5–15.5)
WBC: 9.5 10*3/uL (ref 4.0–10.5)

## 2017-06-02 LAB — LIPASE, BLOOD: Lipase: 52 U/L — ABNORMAL HIGH (ref 11–51)

## 2017-06-02 NOTE — ED Triage Notes (Signed)
Pt reports he was seen here 4 days ago for n/v and abd pain was given an enema and sent home, states pain started back up and now he also feels sob. Pt reports pain around umbilicus, states he feels very weak. Pt a/ox4.

## 2017-06-03 ENCOUNTER — Other Ambulatory Visit: Payer: Self-pay

## 2017-06-03 ENCOUNTER — Encounter (HOSPITAL_COMMUNITY): Payer: Self-pay | Admitting: Emergency Medicine

## 2017-06-03 ENCOUNTER — Observation Stay (HOSPITAL_COMMUNITY): Payer: 59

## 2017-06-03 ENCOUNTER — Emergency Department (HOSPITAL_COMMUNITY): Payer: 59

## 2017-06-03 DIAGNOSIS — Z720 Tobacco use: Secondary | ICD-10-CM | POA: Diagnosis not present

## 2017-06-03 DIAGNOSIS — E876 Hypokalemia: Secondary | ICD-10-CM

## 2017-06-03 DIAGNOSIS — J189 Pneumonia, unspecified organism: Secondary | ICD-10-CM | POA: Diagnosis not present

## 2017-06-03 DIAGNOSIS — R197 Diarrhea, unspecified: Secondary | ICD-10-CM | POA: Diagnosis present

## 2017-06-03 DIAGNOSIS — K4090 Unilateral inguinal hernia, without obstruction or gangrene, not specified as recurrent: Secondary | ICD-10-CM | POA: Diagnosis not present

## 2017-06-03 DIAGNOSIS — R112 Nausea with vomiting, unspecified: Secondary | ICD-10-CM

## 2017-06-03 DIAGNOSIS — R1084 Generalized abdominal pain: Secondary | ICD-10-CM

## 2017-06-03 DIAGNOSIS — J449 Chronic obstructive pulmonary disease, unspecified: Secondary | ICD-10-CM | POA: Diagnosis not present

## 2017-06-03 DIAGNOSIS — K529 Noninfective gastroenteritis and colitis, unspecified: Secondary | ICD-10-CM | POA: Clinically undetermined

## 2017-06-03 DIAGNOSIS — R109 Unspecified abdominal pain: Secondary | ICD-10-CM | POA: Diagnosis present

## 2017-06-03 HISTORY — DX: Pneumonia, unspecified organism: J18.9

## 2017-06-03 LAB — RAPID URINE DRUG SCREEN, HOSP PERFORMED
AMPHETAMINES: NOT DETECTED
Barbiturates: NOT DETECTED
Benzodiazepines: NOT DETECTED
COCAINE: NOT DETECTED
Opiates: NOT DETECTED
Tetrahydrocannabinol: NOT DETECTED

## 2017-06-03 LAB — URINALYSIS, ROUTINE W REFLEX MICROSCOPIC
BILIRUBIN URINE: NEGATIVE
Glucose, UA: NEGATIVE mg/dL
HGB URINE DIPSTICK: NEGATIVE
KETONES UR: NEGATIVE mg/dL
Leukocytes, UA: NEGATIVE
NITRITE: NEGATIVE
PH: 7 (ref 5.0–8.0)
Protein, ur: NEGATIVE mg/dL
Specific Gravity, Urine: 1.009 (ref 1.005–1.030)

## 2017-06-03 LAB — STREP PNEUMONIAE URINARY ANTIGEN: STREP PNEUMO URINARY ANTIGEN: NEGATIVE

## 2017-06-03 MED ORDER — ACETAMINOPHEN 650 MG RE SUPP: 650.0000 mg | Freq: Four times a day (QID) | RECTAL | Status: DC | PRN

## 2017-06-03 MED ORDER — GUAIFENESIN ER 600 MG PO TB12
600.0000 mg | ORAL_TABLET | Freq: Two times a day (BID) | ORAL | Status: DC
  Administered 2017-06-03 – 2017-06-06 (×7): 600 mg via ORAL
  Filled 2017-06-03 (×7): qty 1

## 2017-06-03 MED ORDER — NICOTINE 21 MG/24HR TD PT24
21.0000 mg | MEDICATED_PATCH | Freq: Every day | TRANSDERMAL | Status: DC
  Administered 2017-06-03 – 2017-06-06 (×4): 21 mg via TRANSDERMAL
  Filled 2017-06-03 (×4): qty 1

## 2017-06-03 MED ORDER — ACETAMINOPHEN 325 MG PO TABS
650.0000 mg | ORAL_TABLET | Freq: Four times a day (QID) | ORAL | Status: DC | PRN
  Filled 2017-06-03: qty 2

## 2017-06-03 MED ORDER — IPRATROPIUM-ALBUTEROL 0.5-2.5 (3) MG/3ML IN SOLN: 3.0000 mL | Freq: Four times a day (QID) | RESPIRATORY_TRACT | Status: DC | PRN

## 2017-06-03 MED ORDER — FENTANYL CITRATE (PF) 100 MCG/2ML IJ SOLN
50.0000 ug | Freq: Once | INTRAMUSCULAR | Status: AC
  Administered 2017-06-03: 50 ug via INTRAVENOUS
  Filled 2017-06-03: qty 2

## 2017-06-03 MED ORDER — SODIUM CHLORIDE 0.9 % IV SOLN
INTRAVENOUS | Status: DC
  Administered 2017-06-03 – 2017-06-05 (×4): via INTRAVENOUS

## 2017-06-03 MED ORDER — SODIUM CHLORIDE 0.9 % IV BOLUS (SEPSIS)
1000.0000 mL | Freq: Once | INTRAVENOUS | Status: AC
  Administered 2017-06-03: 1000 mL via INTRAVENOUS

## 2017-06-03 MED ORDER — METRONIDAZOLE IN NACL 5-0.79 MG/ML-% IV SOLN
500.0000 mg | Freq: Three times a day (TID) | INTRAVENOUS | Status: DC
  Administered 2017-06-03 – 2017-06-06 (×8): 500 mg via INTRAVENOUS
  Filled 2017-06-03 (×10): qty 100

## 2017-06-03 MED ORDER — POTASSIUM CHLORIDE 10 MEQ/100ML IV SOLN
10.0000 meq | INTRAVENOUS | Status: AC
  Administered 2017-06-03 (×3): 10 meq via INTRAVENOUS
  Filled 2017-06-03 (×6): qty 100

## 2017-06-03 MED ORDER — FENTANYL CITRATE (PF) 100 MCG/2ML IJ SOLN
50.0000 ug | INTRAMUSCULAR | Status: DC | PRN
Start: 2017-06-03 — End: 2017-06-04
  Administered 2017-06-03 – 2017-06-04 (×8): 50 ug via INTRAVENOUS
  Filled 2017-06-03 (×8): qty 2

## 2017-06-03 MED ORDER — ENOXAPARIN SODIUM 40 MG/0.4ML ~~LOC~~ SOLN
40.0000 mg | SUBCUTANEOUS | Status: DC
  Filled 2017-06-03: qty 0.4

## 2017-06-03 MED ORDER — ONDANSETRON HCL 4 MG PO TABS: 4.0000 mg | ORAL_TABLET | Freq: Four times a day (QID) | ORAL | Status: DC | PRN

## 2017-06-03 MED ORDER — LEVOFLOXACIN IN D5W 500 MG/100ML IV SOLN
500.0000 mg | Freq: Once | INTRAVENOUS | Status: AC
  Administered 2017-06-03: 500 mg via INTRAVENOUS
  Filled 2017-06-03: qty 100

## 2017-06-03 MED ORDER — ONDANSETRON HCL 4 MG/2ML IJ SOLN
4.0000 mg | Freq: Once | INTRAMUSCULAR | Status: AC
  Administered 2017-06-03: 4 mg via INTRAVENOUS
  Filled 2017-06-03: qty 2

## 2017-06-03 MED ORDER — IOPAMIDOL (ISOVUE-300) INJECTION 61%
INTRAVENOUS | Status: AC
  Filled 2017-06-03: qty 30

## 2017-06-03 MED ORDER — FENTANYL CITRATE (PF) 100 MCG/2ML IJ SOLN
25.0000 ug | INTRAMUSCULAR | Status: DC | PRN
  Administered 2017-06-03 (×6): 25 ug via INTRAVENOUS
  Filled 2017-06-03 (×7): qty 2

## 2017-06-03 MED ORDER — IOPAMIDOL (ISOVUE-300) INJECTION 61%
INTRAVENOUS | Status: AC
  Administered 2017-06-03: 100 mL
  Filled 2017-06-03: qty 100

## 2017-06-03 MED ORDER — ONDANSETRON HCL 4 MG/2ML IJ SOLN: 4.0000 mg | Freq: Four times a day (QID) | INTRAMUSCULAR | Status: DC | PRN

## 2017-06-03 NOTE — Progress Notes (Signed)
Received pt in pain. Alert and oriented. Ambulated to bed.

## 2017-06-03 NOTE — Progress Notes (Signed)
Nurse provided patient with first bottle of contrast to drink.  Informed patient a second bottle will be brought at 1345.  Will continue to monitor.

## 2017-06-03 NOTE — Progress Notes (Signed)
PROGRESS NOTE  Malik Fleming ZOX:[id number] DOB: 04/12/1951 DOA: 06/02/2017 PCP: Leanna Battles, MD  HPI/Recap of past 24 hours:  Malik Fleming is a 67 y.o. year old male with medical history significant for COPD, tobacco abuse, right inguinal hernia, and chronic pain who presented on 06/02/2017 with abdominal pain over several days and was found to have acute colitis.    This morning still complains of abdominal pain.  Denies any nausea or vomiting as he has not had any thing to eat in several days.  Assessment/Plan: Principal Problem: Abdominal pain, nausea vomiting, secondary to acute colitis as evident on CT abdomen. -IV antiemetics -IV Levaquin and Flagyl, de-escalate to oral once able to tolerate -Expect improvement with supportive care    Hypokalemia, related to previous vomiting.  Status post potassium repletion - Monitor on BMP    Right inguinal hernia, stable and reducible on exam -Continue to monitor    Lingular pneumonia, stable no overt respiratory symptoms.  However noted pneumonia on chest x-ray on admission the patient started on Levaquin at the time.  Strep pneumo negative -Legionella pending -No sputum culture collected given no productive cough -Continue Levaquin as mentioned above for colitis treatment in addition to Flagyl   Code Status: Full code  Family Communication: No family at bedside  Disposition Plan: IV antibiotics until able to tolerate orals   Consultants:  None  Procedures:  None  Antimicrobials:  Levaquin 06/02/2017, Flagyl 06/03/2017  Cultures:  06/03/2017 blood cultures: Negative growth to date  DVT prophylaxis: Blood cultures   Objective: Vitals:   06/03/17 0500 06/03/17 0630 06/03/17 1410 06/03/17 2045  BP: (!) 164/91 (!) 151/76 (!) 178/79 (!) 145/74  Pulse: 79 72 60 68  Resp: 19 18 17 17   Temp:  99.5 F (37.5 C) 98.5 F (36.9 C) 98.6 F (37 C)  TempSrc:  Oral Oral Oral  SpO2: 96% 97% 98% 98%     Intake/Output Summary (Last 24 hours) at 06/03/2017 2232 Last data filed at 06/03/2017 1700 Gross per 24 hour  Intake 2610.33 ml  Output -  Net 2610.33 ml   There were no vitals filed for this visit.  Exam:  General: Lying in bed, in no acute distress, obviously uncomfortable Eyes: EOMI, anicteric ENT: Oral Mucosa clear and moist Neck: normal, no appreciable JVD Cardiovascular: regular rate and rhythm, no murmurs, rubs or gallops, Peripheral Pulses Present, no edema Respiratory: Normal respiratory effort, lungs clear to auscultation bilaterally, no rales, wheezes or rhonchi Abdomen: soft, non-distended, tender in right upper quadrant and left lower quadrant, no rebound, minimal guarding Skin: No Rash Musculoskeletal:No clubbing / cyanosis. No joint deformity upper and lower extremities. Good ROM, no contractures. Normal muscle tone Neurologic: Grossly no focal neuro deficit.Mental status AAOx3, speech normal, Psychiatric:Appropriate affect, and mood  Data Reviewed: CBC: Recent Labs  Lab 05/29/17 2032 06/02/17 1555  WBC 10.2 9.5  HGB 15.4 14.9  HCT 44.5 41.5  MCV 104.0* 100.2*  PLT 208 811   Basic Metabolic Panel: Recent Labs  Lab 05/29/17 2032 06/02/17 1555  NA 136 130*  K 3.5 3.1*  CL 102 95*  CO2 24 25  GLUCOSE 137* 118*  BUN 7 6  CREATININE 0.63 0.55*  CALCIUM 9.5 9.2   GFR: Estimated Creatinine Clearance: 92.5 mL/min (A) (by C-G formula based on SCr of 0.55 mg/dL (L)). Liver Function Tests: Recent Labs  Lab 05/29/17 2032 06/02/17 1555  AST 27 30  ALT 25 27  ALKPHOS 82 80  BILITOT 0.9  1.5*  PROT 7.9 7.7  ALBUMIN 4.2 4.1   Recent Labs  Lab 05/29/17 2032 06/02/17 1555  LIPASE 40 52*   No results for input(s): AMMONIA in the last 168 hours. Coagulation Profile: No results for input(s): INR, PROTIME in the last 168 hours. Cardiac Enzymes: No results for input(s): CKTOTAL, CKMB, CKMBINDEX, TROPONINI in the last 168 hours. BNP (last 3  results) No results for input(s): PROBNP in the last 8760 hours. HbA1C: No results for input(s): HGBA1C in the last 72 hours. CBG: No results for input(s): GLUCAP in the last 168 hours. Lipid Profile: No results for input(s): CHOL, HDL, LDLCALC, TRIG, CHOLHDL, LDLDIRECT in the last 72 hours. Thyroid Function Tests: No results for input(s): TSH, T4TOTAL, FREET4, T3FREE, THYROIDAB in the last 72 hours. Anemia Panel: No results for input(s): VITAMINB12, FOLATE, FERRITIN, TIBC, IRON, RETICCTPCT in the last 72 hours. Urine analysis:    Component Value Date/Time   COLORURINE YELLOW 06/03/2017 0558   APPEARANCEUR CLEAR 06/03/2017 0558   LABSPEC 1.009 06/03/2017 0558   PHURINE 7.0 06/03/2017 0558   GLUCOSEU NEGATIVE 06/03/2017 0558   HGBUR NEGATIVE 06/03/2017 0558   BILIRUBINUR NEGATIVE 06/03/2017 0558   KETONESUR NEGATIVE 06/03/2017 0558   PROTEINUR NEGATIVE 06/03/2017 0558   NITRITE NEGATIVE 06/03/2017 0558   LEUKOCYTESUR NEGATIVE 06/03/2017 0558   Sepsis Labs: @LABRCNTIP (procalcitonin:4,lacticidven:4)  )No results found for this or any previous visit (from the past 240 hour(s)).    Studies: Dg Abdomen 1 View  Result Date: 06/03/2017 CLINICAL DATA:  Acute onset of nausea, vomiting and periumbilical abdominal pain. Shortness of breath. EXAM: ABDOMEN - 1 VIEW COMPARISON:  Abdominal radiograph performed 05/29/2017 FINDINGS: The visualized bowel gas pattern is unremarkable. Scattered air filled loops of small and large bowel are seen; no abnormal dilatation of small bowel loops is seen to suggest small bowel obstruction. No free intra-abdominal air is identified, though evaluation for free air is limited on a single supine view. Clips are noted within the right upper quadrant, reflecting prior cholecystectomy. The visualized osseous structures are within normal limits; the sacroiliac joints are unremarkable in appearance. The visualized lung bases are essentially clear. IMPRESSION:  Unremarkable bowel gas pattern; no free intra-abdominal air seen. Electronically Signed   By: Garald Balding M.D.   On: 06/03/2017 03:33   Ct Abdomen Pelvis W Contrast  Result Date: 06/03/2017 CLINICAL DATA:  Lower abdominal pain.  Nausea vomiting and diarrhea. EXAM: CT ABDOMEN AND PELVIS WITH CONTRAST TECHNIQUE: Multidetector CT imaging of the abdomen and pelvis was performed using the standard protocol following bolus administration of intravenous contrast. CONTRAST:  194mL ISOVUE-300 IOPAMIDOL (ISOVUE-300) INJECTION 61% COMPARISON:  None FINDINGS: Lower chest: No acute abnormality. Hepatobiliary: No focal liver abnormality is seen. Status post cholecystectomy. No biliary dilatation. Pancreas: Unremarkable. No pancreatic ductal dilatation or surrounding inflammatory changes. Spleen: Normal in size without focal abnormality. Adrenals/Urinary Tract: The adrenal glands are normal. Unremarkable appearance of the kidneys. No mass or hydronephrosis identified. The urinary bladder appears normal. Stomach/Bowel: The stomach is normal. The small bowel loops have a normal course and caliber without obstruction. Numerous colonic diverticula identified. Wall thickening of the sigmoid colon is identified without surrounding inflammation. No pneumatosis or perforation. Vascular/Lymphatic: Aortic atherosclerosis. No aneurysm. No upper abdominal adenopathy. No pelvic or inguinal adenopathy. Reproductive: Prostate gland is unremarkable. Calcification of the left seminal vesicle noted. Other: No ascites or focal fluid collections. There is a right inguinal hernia which contains fat only. Musculoskeletal: No aggressive lytic or sclerotic bone lesions. There is  degenerative disc disease noted within the lumbar spine. IMPRESSION: 1. There is wall thickening involving the sigmoid colon and rectum which may reflect segmental colitis. No complicating features identified and specifically no pneumatosis, perforation, obstruction or  abscess. 2.  Aortic Atherosclerosis (ICD10-I70.0). 3. Previous cholecystectomy. Electronically Signed   By: Kerby Moors M.D.   On: 06/03/2017 15:54    Scheduled Meds: . enoxaparin (LOVENOX) injection  40 mg Subcutaneous Q24H  . guaiFENesin  600 mg Oral BID  . iopamidol      . nicotine  21 mg Transdermal Daily    Continuous Infusions: . sodium chloride 100 mL/hr at 06/03/17 0501  . metronidazole       LOS: 0 days     Desiree Hane, MD [facility] Pager [phone]  If 7PM-7AM, please contact night-coverage [email or web address] Password [facility] 06/03/2017, 10:32 PM

## 2017-06-03 NOTE — ED Notes (Signed)
Pt called to go to B14 by RN M, no answer pt moved back to waiting
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
## 2017-06-04 DIAGNOSIS — K529 Noninfective gastroenteritis and colitis, unspecified: Secondary | ICD-10-CM | POA: Diagnosis not present

## 2017-06-04 DIAGNOSIS — R109 Unspecified abdominal pain: Secondary | ICD-10-CM | POA: Diagnosis not present

## 2017-06-04 DIAGNOSIS — R112 Nausea with vomiting, unspecified: Secondary | ICD-10-CM | POA: Diagnosis not present

## 2017-06-04 LAB — COMPREHENSIVE METABOLIC PANEL
ALK PHOS: 63 U/L (ref 38–126)
ALT: 22 U/L (ref 17–63)
ANION GAP: 8 (ref 5–15)
AST: 22 U/L (ref 15–41)
Albumin: 3.4 g/dL — ABNORMAL LOW (ref 3.5–5.0)
BUN: 7 mg/dL (ref 6–20)
CO2: 22 mmol/L (ref 22–32)
CREATININE: 0.65 mg/dL (ref 0.61–1.24)
Calcium: 8.4 mg/dL — ABNORMAL LOW (ref 8.9–10.3)
Chloride: 104 mmol/L (ref 101–111)
Glucose, Bld: 99 mg/dL (ref 65–99)
Potassium: 3.6 mmol/L (ref 3.5–5.1)
SODIUM: 134 mmol/L — AB (ref 135–145)
Total Bilirubin: 1.4 mg/dL — ABNORMAL HIGH (ref 0.3–1.2)
Total Protein: 6.5 g/dL (ref 6.5–8.1)

## 2017-06-04 LAB — LEGIONELLA PNEUMOPHILA SEROGP 1 UR AG: L. PNEUMOPHILA SEROGP 1 UR AG: NEGATIVE

## 2017-06-04 LAB — CBC
HCT: 38.1 % — ABNORMAL LOW (ref 39.0–52.0)
Hemoglobin: 13.4 g/dL (ref 13.0–17.0)
MCH: 35.9 pg — AB (ref 26.0–34.0)
MCHC: 35.2 g/dL (ref 30.0–36.0)
MCV: 102.1 fL — AB (ref 78.0–100.0)
PLATELETS: 190 10*3/uL (ref 150–400)
RBC: 3.73 MIL/uL — ABNORMAL LOW (ref 4.22–5.81)
RDW: 12.6 % (ref 11.5–15.5)
WBC: 6.1 10*3/uL (ref 4.0–10.5)

## 2017-06-04 LAB — LACTIC ACID, PLASMA
Lactic Acid, Venous: 0.7 mmol/L (ref 0.5–1.9)
Lactic Acid, Venous: 1.2 mmol/L (ref 0.5–1.9)

## 2017-06-04 MED ORDER — CIPROFLOXACIN IN D5W 400 MG/200ML IV SOLN: 400.0000 mg | Freq: Two times a day (BID) | INTRAVENOUS | Status: DC

## 2017-06-04 MED ORDER — HYDROMORPHONE HCL 1 MG/ML IJ SOLN
0.5000 mg | INTRAMUSCULAR | Status: DC | PRN
  Administered 2017-06-04 (×2): 0.5 mg via INTRAVENOUS
  Filled 2017-06-04 (×2): qty 1

## 2017-06-04 MED ORDER — LEVOFLOXACIN IN D5W 750 MG/150ML IV SOLN
750.0000 mg | INTRAVENOUS | Status: DC
  Administered 2017-06-04 – 2017-06-05 (×2): 750 mg via INTRAVENOUS
  Filled 2017-06-04 (×3): qty 150

## 2017-06-04 MED ORDER — HYDROMORPHONE HCL 1 MG/ML IJ SOLN
1.0000 mg | INTRAMUSCULAR | Status: DC | PRN
  Administered 2017-06-04 – 2017-06-06 (×10): 1 mg via INTRAVENOUS
  Filled 2017-06-04 (×10): qty 1

## 2017-06-04 MED ORDER — POTASSIUM CHLORIDE 10 MEQ/100ML IV SOLN
10.0000 meq | INTRAVENOUS | Status: AC
  Administered 2017-06-04 (×3): 10 meq via INTRAVENOUS
  Filled 2017-06-04 (×4): qty 100

## 2017-06-04 NOTE — Progress Notes (Signed)
PROGRESS NOTE  Malik Fleming UVO:[id number] DOB: 1950-12-13 DOA: 06/02/2017 PCP: Leanna Battles, MD  HPI/Recap of past 24 hours:  Malik Fleming is a 67 y.o. year old male with medical history significant for COPD, tobacco abuse, right inguinal hernia, and chronic pain who presented on 06/02/2017 with abdominal pain over several days and was found to have acute recto-sigmoid colitis.  06/04/17 - Patient seen alongside patient's nurse. Patient continues to report lower abdominal pain, nausea, vomiting and diarrhea (as per Patient). Patient is also asking for the Fentanyl to be changed to dilaudid. No fever or chills. Will check Lactic acid level. Will add IV Levaquin to the flagyl. Will send stool for analysis if diarrheal/watery stool is noted. As per patient's request, will change PRN pain medication from Fentanyl to dilaudid. Low threshold to consult GI. Continue IVF. Continue to monitor renal function and electrolytes.  Assessment/Plan: - Acute recto-sigmoid colitis:  IV Levaquin and Flagyl  IV Hydration  Monitor renal function and electrolytes  Pain control.  - Hypokalemia:  Continue to monitor and replete.  - Abdominal Pain:  Likely secondary to above. Manage expectantly.     Code Status: Full code  Family Communication:    Disposition Plan: IV antibiotics until able to tolerate orals   Consultants:  None  Procedures:  None  Antimicrobials:  Levaquin 06/02/2017, Flagyl 06/03/2017  Cultures:  06/03/2017 blood cultures: Negative growth to date  DVT prophylaxis: Blood cultures   Objective: Vitals:   06/03/17 1410 06/03/17 2045 06/03/17 2231 06/04/17 0451  BP: (!) 178/79 (!) 145/74  (!) 146/80  Pulse: 60 68  72  Resp: 17 17  17   Temp: 98.5 F (36.9 C) 98.6 F (37 C)  98.2 F (36.8 C)  TempSrc: Oral Oral  Oral  SpO2: 98% 98%  92%  Weight:   79.4 kg (175 lb)   Height:   5\' 10"  (1.778 m)     Intake/Output Summary (Last 24 hours) at 06/04/2017  1252 Last data filed at 06/04/2017 1144 Gross per 24 hour  Intake 2710.33 ml  Output -  Net 2710.33 ml   Filed Weights   06/03/17 2231  Weight: 79.4 kg (175 lb)    Exam:  General: Lying in bed, in no acute distress, obviously uncomfortable Eyes: EOMI, anicteric ENT: Oral Mucosa clear and moist Neck: normal, no appreciable JVD Cardiovascular: regular rate and rhythm, no murmurs, rubs or gallops, Peripheral Pulses Present, no edema Respiratory: Normal respiratory effort, lungs clear to auscultation bilaterally, no rales, wheezes or rhonchi Abdomen: soft, non-distended, Vague tenderness lower abdominal area. Neurologic: Grossly no focal neuro deficit.Mental status AAOx3, speech normal, Extremities - No edema  Data Reviewed: CBC: Recent Labs  Lab 05/29/17 2032 06/02/17 1555 06/04/17 0627  WBC 10.2 9.5 6.1  HGB 15.4 14.9 13.4  HCT 44.5 41.5 38.1*  MCV 104.0* 100.2* 102.1*  PLT 208 236 742   Basic Metabolic Panel: Recent Labs  Lab 05/29/17 2032 06/02/17 1555 06/04/17 0627  NA 136 130* 134*  K 3.5 3.1* 3.6  CL 102 95* 104  CO2 24 25 22   GLUCOSE 137* 118* 99  BUN 7 6 7   CREATININE 0.63 0.55* 0.65  CALCIUM 9.5 9.2 8.4*   GFR: Estimated Creatinine Clearance: 92.5 mL/min (by C-G formula based on SCr of 0.65 mg/dL). Liver Function Tests: Recent Labs  Lab 05/29/17 2032 06/02/17 1555 06/04/17 0627  AST 27 30 22   ALT 25 27 22   ALKPHOS 82 80 63  BILITOT 0.9 1.5* 1.4*  PROT 7.9 7.7 6.5  ALBUMIN 4.2 4.1 3.4*   Recent Labs  Lab 05/29/17 2032 06/02/17 1555  LIPASE 40 52*   No results for input(s): AMMONIA in the last 168 hours. Coagulation Profile: No results for input(s): INR, PROTIME in the last 168 hours. Cardiac Enzymes: No results for input(s): CKTOTAL, CKMB, CKMBINDEX, TROPONINI in the last 168 hours. BNP (last 3 results) No results for input(s): PROBNP in the last 8760 hours. HbA1C: No results for input(s): HGBA1C in the last 72 hours. CBG: No  results for input(s): GLUCAP in the last 168 hours. Lipid Profile: No results for input(s): CHOL, HDL, LDLCALC, TRIG, CHOLHDL, LDLDIRECT in the last 72 hours. Thyroid Function Tests: No results for input(s): TSH, T4TOTAL, FREET4, T3FREE, THYROIDAB in the last 72 hours. Anemia Panel: No results for input(s): VITAMINB12, FOLATE, FERRITIN, TIBC, IRON, RETICCTPCT in the last 72 hours. Urine analysis:    Component Value Date/Time   COLORURINE YELLOW 06/03/2017 0558   APPEARANCEUR CLEAR 06/03/2017 0558   LABSPEC 1.009 06/03/2017 0558   PHURINE 7.0 06/03/2017 0558   GLUCOSEU NEGATIVE 06/03/2017 0558   HGBUR NEGATIVE 06/03/2017 0558   BILIRUBINUR NEGATIVE 06/03/2017 0558   KETONESUR NEGATIVE 06/03/2017 0558   PROTEINUR NEGATIVE 06/03/2017 0558   NITRITE NEGATIVE 06/03/2017 0558   LEUKOCYTESUR NEGATIVE 06/03/2017 0558   Sepsis Labs: @LABRCNTIP (procalcitonin:4,lacticidven:4)  )No results found for this or any previous visit (from the past 240 hour(s)).    Studies: Ct Abdomen Pelvis W Contrast  Result Date: 06/03/2017 CLINICAL DATA:  Lower abdominal pain.  Nausea vomiting and diarrhea. EXAM: CT ABDOMEN AND PELVIS WITH CONTRAST TECHNIQUE: Multidetector CT imaging of the abdomen and pelvis was performed using the standard protocol following bolus administration of intravenous contrast. CONTRAST:  121mL ISOVUE-300 IOPAMIDOL (ISOVUE-300) INJECTION 61% COMPARISON:  None FINDINGS: Lower chest: No acute abnormality. Hepatobiliary: No focal liver abnormality is seen. Status post cholecystectomy. No biliary dilatation. Pancreas: Unremarkable. No pancreatic ductal dilatation or surrounding inflammatory changes. Spleen: Normal in size without focal abnormality. Adrenals/Urinary Tract: The adrenal glands are normal. Unremarkable appearance of the kidneys. No mass or hydronephrosis identified. The urinary bladder appears normal. Stomach/Bowel: The stomach is normal. The small bowel loops have a normal course  and caliber without obstruction. Numerous colonic diverticula identified. Wall thickening of the sigmoid colon is identified without surrounding inflammation. No pneumatosis or perforation. Vascular/Lymphatic: Aortic atherosclerosis. No aneurysm. No upper abdominal adenopathy. No pelvic or inguinal adenopathy. Reproductive: Prostate gland is unremarkable. Calcification of the left seminal vesicle noted. Other: No ascites or focal fluid collections. There is a right inguinal hernia which contains fat only. Musculoskeletal: No aggressive lytic or sclerotic bone lesions. There is degenerative disc disease noted within the lumbar spine. IMPRESSION: 1. There is wall thickening involving the sigmoid colon and rectum which may reflect segmental colitis. No complicating features identified and specifically no pneumatosis, perforation, obstruction or abscess. 2.  Aortic Atherosclerosis (ICD10-I70.0). 3. Previous cholecystectomy. Electronically Signed   By: Kerby Moors M.D.   On: 06/03/2017 15:54    Scheduled Meds: . enoxaparin (LOVENOX) injection  40 mg Subcutaneous Q24H  . guaiFENesin  600 mg Oral BID  . nicotine  21 mg Transdermal Daily    Continuous Infusions: . sodium chloride 100 mL/hr at 06/04/17 0600  . ciprofloxacin    . metronidazole Stopped (06/04/17 0744)  . potassium chloride       LOS: 0 days     Malik Public, MD [facility] Pager [phone]  If 7PM-7AM, please contact night-coverage  [email or web address] Password [facility] 06/04/2017, 12:52 PM

## 2017-06-05 DIAGNOSIS — R109 Unspecified abdominal pain: Secondary | ICD-10-CM

## 2017-06-05 DIAGNOSIS — R112 Nausea with vomiting, unspecified: Secondary | ICD-10-CM | POA: Diagnosis not present

## 2017-06-05 DIAGNOSIS — K529 Noninfective gastroenteritis and colitis, unspecified: Secondary | ICD-10-CM | POA: Diagnosis not present

## 2017-06-05 LAB — RENAL FUNCTION PANEL
Albumin: 3.4 g/dL — ABNORMAL LOW (ref 3.5–5.0)
Anion gap: 9 (ref 5–15)
BUN: 7 mg/dL (ref 6–20)
CO2: 21 mmol/L — ABNORMAL LOW (ref 22–32)
Calcium: 8.5 mg/dL — ABNORMAL LOW (ref 8.9–10.3)
Chloride: 105 mmol/L (ref 101–111)
Creatinine, Ser: 0.69 mg/dL (ref 0.61–1.24)
GFR calc Af Amer: >60 mL/min (ref >60)
GFR calc non Af Amer: >60 mL/min (ref >60)
Glucose, Bld: 94 mg/dL (ref 65–99)
Phosphorus: 3.8 mg/dL (ref 2.5–4.6)
Potassium: 4.4 mmol/L (ref 3.5–5.1)
Sodium: 135 mmol/L (ref 135–145)

## 2017-06-05 NOTE — Progress Notes (Signed)
PROGRESS NOTE  Malik Fleming BPZ:[id number] DOB: 25-Oct-1950 DOA: 06/02/2017 PCP: Leanna Battles, MD  Summary:  Malik Fleming is a 67 y.o. year old male with medical history significant for COPD, tobacco abuse, right inguinal hernia, and chronic pain who presented on 06/02/2017 with abdominal pain over several days and was found to have acute recto-sigmoid colitis.  06/05/17 - Patient seen alongside patient's nurse. Patient denied fever, chills, nausea, vomiting or diarrhea. Patient tolerated his breakfast. Despite above symptoms having resolved, patient has continued to report abdominal pain, and has kept asking for IV Dilaudid. Need to wean patient off of IV Dilaudid discussed with the patient extensively in the presence of the patient's Nurse. Patient tells [place] that his last Colonoscopy over 10 years ago revealed polyps but patient has not followed up with his GI Provider, but tells [place] that he has an appointment coming up with the GI Provider. Need to follow up with the GI Physician discussed with the patient extensively and the patient voiced understanding. Patient tells me that his local GI Physician is Dr. Earlean Shawl.    Assessment/Plan: - Acute recto-sigmoid colitis:  IV Levaquin and Flagyl  IV Hydration  Monitor renal function and electrolytes  Pain control.  Pursue disposition when patient is agreeable (patient is not keen on being discharged on oral pain medication for now)  - Hypokalemia:  Resolved.  - Abdominal Pain:     Code Status: Full code  Family Communication:    Disposition Plan: Home eventually (when patient is agreeable).   Consultants:  None  Procedures:  None  Antimicrobials:  Levaquin 06/02/2017, Flagyl 06/03/2017  Cultures:  06/03/2017 blood cultures: Negative growth to date  DVT prophylaxis: Blood cultures   Objective: Vitals:   06/04/17 0451 06/04/17 1459 06/04/17 2137 06/05/17 0553  BP: (!) 146/80 (!) 145/82 128/72 (!) 141/75  Pulse:  72 74 65 61  Resp: 17 18 16 18   Temp: 98.2 F (36.8 C) 98.4 F (36.9 C) 98.1 F (36.7 C) 98.2 F (36.8 C)  TempSrc: Oral Oral Oral Oral  SpO2: 92% 94% 94% 97%  Weight:      Height:        Intake/Output Summary (Last 24 hours) at 06/05/2017 1320 Last data filed at 06/05/2017 0900 Gross per 24 hour  Intake 4351.67 ml  Output -  Net 4351.67 ml   Filed Weights   06/03/17 2231  Weight: 79.4 kg (175 lb)    Exam:  General: Lying in bed, in no acute distress, obviously uncomfortable Eyes: EOMI, anicteric ENT: Oral Mucosa clear and moist Neck: normal, no appreciable JVD Cardiovascular: regular rate and rhythm, no murmurs, rubs or gallops, Peripheral Pulses Present, no edema Respiratory: Normal respiratory effort, lungs clear to auscultation bilaterally, no rales, wheezes or rhonchi Abdomen: soft, non-distended, Vague tenderness lower abdominal area. Neurologic: Grossly no focal neuro deficit.Mental status AAOx3, speech normal, Extremities - No edema  Data Reviewed: CBC: Recent Labs  Lab 05/29/17 2032 06/02/17 1555 06/04/17 0627  WBC 10.2 9.5 6.1  HGB 15.4 14.9 13.4  HCT 44.5 41.5 38.1*  MCV 104.0* 100.2* 102.1*  PLT 208 236 242   Basic Metabolic Panel: Recent Labs  Lab 05/29/17 2032 06/02/17 1555 06/04/17 0627  NA 136 130* 134*  K 3.5 3.1* 3.6  CL 102 95* 104  CO2 24 25 22   GLUCOSE 137* 118* 99  BUN 7 6 7   CREATININE 0.63 0.55* 0.65  CALCIUM 9.5 9.2 8.4*   GFR: Estimated Creatinine Clearance: 92.5 mL/min (by  C-G formula based on SCr of 0.65 mg/dL). Liver Function Tests: Recent Labs  Lab 05/29/17 2032 06/02/17 1555 06/04/17 0627  AST 27 30 22   ALT 25 27 22   ALKPHOS 82 80 63  BILITOT 0.9 1.5* 1.4*  PROT 7.9 7.7 6.5  ALBUMIN 4.2 4.1 3.4*   Recent Labs  Lab 05/29/17 2032 06/02/17 1555  LIPASE 40 52*   No results for input(s): AMMONIA in the last 168 hours. Coagulation Profile: No results for input(s): INR, PROTIME in the last 168 hours. Cardiac  Enzymes: No results for input(s): CKTOTAL, CKMB, CKMBINDEX, TROPONINI in the last 168 hours. BNP (last 3 results) No results for input(s): PROBNP in the last 8760 hours. HbA1C: No results for input(s): HGBA1C in the last 72 hours. CBG: No results for input(s): GLUCAP in the last 168 hours. Lipid Profile: No results for input(s): CHOL, HDL, LDLCALC, TRIG, CHOLHDL, LDLDIRECT in the last 72 hours. Thyroid Function Tests: No results for input(s): TSH, T4TOTAL, FREET4, T3FREE, THYROIDAB in the last 72 hours. Anemia Panel: No results for input(s): VITAMINB12, FOLATE, FERRITIN, TIBC, IRON, RETICCTPCT in the last 72 hours. Urine analysis:    Component Value Date/Time   COLORURINE YELLOW 06/03/2017 0558   APPEARANCEUR CLEAR 06/03/2017 0558   LABSPEC 1.009 06/03/2017 0558   PHURINE 7.0 06/03/2017 0558   GLUCOSEU NEGATIVE 06/03/2017 0558   HGBUR NEGATIVE 06/03/2017 0558   BILIRUBINUR NEGATIVE 06/03/2017 0558   KETONESUR NEGATIVE 06/03/2017 0558   PROTEINUR NEGATIVE 06/03/2017 0558   NITRITE NEGATIVE 06/03/2017 0558   LEUKOCYTESUR NEGATIVE 06/03/2017 0558   Sepsis Labs: @LABRCNTIP (procalcitonin:4,lacticidven:4)  ) Recent Results (from the past 240 hour(s))  Culture, blood (routine x 2) Call MD if unable to obtain prior to antibiotics being given     Status: None (Preliminary result)   Collection Time: 06/03/17  3:09 AM  Result Value Ref Range Status   Specimen Description BLOOD RIGHT FOREARM  Final   Special Requests   Final    BOTTLES DRAWN AEROBIC AND ANAEROBIC Blood Culture results may not be optimal due to an excessive volume of blood received in culture bottles   Culture NO GROWTH 2 DAYS  Final   Report Status PENDING  Incomplete  Culture, blood (routine x 2) Call MD if unable to obtain prior to antibiotics being given     Status: None (Preliminary result)   Collection Time: 06/03/17  4:49 AM  Result Value Ref Range Status   Specimen Description BLOOD RIGHT FOREARM  Final    Special Requests   Final    BOTTLES DRAWN AEROBIC AND ANAEROBIC Blood Culture results may not be optimal due to an excessive volume of blood received in culture bottles   Culture NO GROWTH 2 DAYS  Final   Report Status PENDING  Incomplete      Studies: No results found.  Scheduled Meds: . enoxaparin (LOVENOX) injection  40 mg Subcutaneous Q24H  . guaiFENesin  600 mg Oral BID  . nicotine  21 mg Transdermal Daily    Continuous Infusions: . sodium chloride 100 mL/hr at 06/05/17 0421  . levofloxacin (LEVAQUIN) IV 750 mg (06/04/17 1528)  . metronidazole 500 mg (06/05/17 1319)     LOS: 1 day     Bonnell Public, MD [facility] Pager [phone]  If 7PM-7AM, please contact night-coverage [email or web address] Password [email or web address] 06/05/2017, 1:20 PM

## 2017-06-06 DIAGNOSIS — R109 Unspecified abdominal pain: Secondary | ICD-10-CM | POA: Diagnosis not present

## 2017-06-06 DIAGNOSIS — R112 Nausea with vomiting, unspecified: Secondary | ICD-10-CM | POA: Diagnosis not present

## 2017-06-06 DIAGNOSIS — K529 Noninfective gastroenteritis and colitis, unspecified: Secondary | ICD-10-CM | POA: Diagnosis not present

## 2017-06-06 MED ORDER — METRONIDAZOLE 500 MG PO TABS: 500.0000 mg | ORAL_TABLET | Freq: Three times a day (TID) | ORAL | 0 refills | Status: AC

## 2017-06-06 MED ORDER — LEVOFLOXACIN 750 MG PO TABS: 750.0000 mg | ORAL_TABLET | Freq: Every day | ORAL | 0 refills | Status: AC

## 2017-06-06 MED ORDER — OXYCODONE-ACETAMINOPHEN 5-325 MG PO TABS: 1.0000 | ORAL_TABLET | Freq: Four times a day (QID) | ORAL | 0 refills | Status: DC | PRN

## 2017-06-06 NOTE — Discharge Summary (Signed)
Physician Discharge Summary  Patient ID: Malik Fleming MRN: [id number] DOB/AGE: 02/21/1951 67 y.o.  Admit date: 06/02/2017 Discharge date: 06/06/2017  Admission Diagnoses:  Discharge Diagnoses:  Principal Problem:   Nausea and vomiting Active Problems:   Tobacco user   COPD (chronic obstructive pulmonary disease) (HCC)   Hypokalemia   Right inguinal hernia   Lingular pneumonia   Diarrhea   Abdominal pain   Colitis  HOSPITAL COURSE: Patient is a 67 year old male with past medical history significant for COPD, tobacco abuse, right inguinal hernia, and chronic pain. Patient was admitted with complaints of abdominal pain, and work up done revealed recto-sigmoid colitis. Patient was admitted and managed with IV antibiotics. Patient continued to request IV dilaudid during the hospital stay. Abdominal pain has resolved. Patient maintains that he is known to a local GI Physician, Dr. Earlean Shawl. Patient informed us that he already has an appointment with Dr. Earlean Shawl. Patient will be discharged to the care of the PCP and GI Physician, Dr. Earlean Shawl.  Discharged Condition: Stable  Hospital Course:   Significant Diagnostic Studies: CT Abdomen and Pelvis revealed wall thickening involving the sigmoid colon and rectum said to possibly reflect segmental colitis. No complicating featuresidentified and specifically no pneumatosis, perforation, obstruction or abscess. CT also revealed Aortic Atherosclerosis and previous Cholecystectomy.  Discharge Exam: Blood pressure (!) 151/82, pulse 73, temperature 98.1 F (36.7 C), temperature source Oral, resp. rate 18, height 5\' 10"  (1.778 m), weight 79.4 kg (175 lb), SpO2 96 %.   Disposition: 01-Home or Self Care  Discharge Instructions    Call MD for:   Complete by:  As directed    Call MD with worsening of symptoms   Diet - low sodium heart healthy   Complete by:  As directed    Increase activity slowly   Complete by:  As directed      Allergies as of  06/06/2017      Reactions   Asa [aspirin] Nausea And Vomiting   Penicillins Nausea And Vomiting   Has patient had a PCN reaction causing immediate rash, facial/tongue/throat swelling, SOB or lightheadedness with hypotension: unknown Has patient had a PCN reaction causing severe rash involving mucus membranes or skin necrosis: unknown pt does not remember.  Has patient had a PCN reaction that required hospitalization: No Has patient had a PCN reaction occurring within the last 10 years: No If all of the above answers are "NO", then may proceed with Cephalosporin use.      Medication List    STOP taking these medications   dicyclomine 20 MG tablet Commonly known as:  BENTYL   HYDROmorphone 2 MG tablet Commonly known as:  DILAUDID   lactulose 10 GM/15ML solution Commonly known as:  CHRONULAC   ondansetron 4 MG disintegrating tablet Commonly known as:  ZOFRAN ODT   ondansetron 4 MG tablet Commonly known as:  ZOFRAN   ranitidine 150 MG tablet Commonly known as:  ZANTAC     TAKE these medications   levofloxacin 750 MG tablet Commonly known as:  LEVAQUIN Take 1 tablet (750 mg total) by mouth daily for 5 days.   metroNIDAZOLE 500 MG tablet Commonly known as:  FLAGYL Take 1 tablet (500 mg total) by mouth 3 (three) times daily for 5 days.   multivitamin with minerals Tabs tablet Take 1 tablet by mouth daily.   oxyCODONE-acetaminophen 5-325 MG tablet Commonly known as:  ROXICET Take 1 tablet by mouth every 6 (six) hours as needed.   VITAMIN C PO  Take 1 tablet by mouth daily.        SignedBonnell Public 06/06/2017, 2:00 PM

## 2017-06-06 NOTE — Progress Notes (Signed)
Pt verbalized of feeling better today. Some abd pain but better. Tolerating regular diet. Discharge instructions given to pt. Discharged home.

## 2017-06-08 LAB — CULTURE, BLOOD (ROUTINE X 2)
Culture: NO GROWTH
Culture: NO GROWTH

## 2017-06-16 ENCOUNTER — Encounter: Payer: Self-pay | Admitting: Gastroenterology

## 2017-07-23 ENCOUNTER — Encounter (INDEPENDENT_AMBULATORY_CARE_PROVIDER_SITE_OTHER): Payer: Self-pay

## 2017-07-23 ENCOUNTER — Encounter: Payer: Self-pay | Admitting: Gastroenterology

## 2017-07-23 ENCOUNTER — Ambulatory Visit (INDEPENDENT_AMBULATORY_CARE_PROVIDER_SITE_OTHER): Payer: 59 | Admitting: Gastroenterology

## 2017-07-23 ENCOUNTER — Other Ambulatory Visit (INDEPENDENT_AMBULATORY_CARE_PROVIDER_SITE_OTHER): Payer: 59

## 2017-07-23 VITALS — BP 110/70 | HR 68 | Ht 70.0 in | Wt 188.0 lb

## 2017-07-23 DIAGNOSIS — Z86010 Personal history of colonic polyps: Secondary | ICD-10-CM

## 2017-07-23 DIAGNOSIS — R9389 Abnormal findings on diagnostic imaging of other specified body structures: Secondary | ICD-10-CM | POA: Diagnosis not present

## 2017-07-23 DIAGNOSIS — K7460 Unspecified cirrhosis of liver: Secondary | ICD-10-CM

## 2017-07-23 LAB — COMPREHENSIVE METABOLIC PANEL
ALBUMIN: 4 g/dL (ref 3.5–5.2)
ALK PHOS: 85 U/L (ref 39–117)
ALT: 27 U/L (ref 0–53)
AST: 24 U/L (ref 0–37)
BILIRUBIN TOTAL: 0.6 mg/dL (ref 0.2–1.2)
BUN: 15 mg/dL (ref 6–23)
CO2: 31 mEq/L (ref 19–32)
Calcium: 9.5 mg/dL (ref 8.4–10.5)
Chloride: 102 mEq/L (ref 96–112)
Creatinine, Ser: 0.8 mg/dL (ref 0.40–1.50)
GFR: 102.32 mL/min (ref >60.00)
GLUCOSE: 105 mg/dL — AB (ref 70–99)
POTASSIUM: 5.1 meq/L (ref 3.5–5.1)
SODIUM: 139 meq/L (ref 135–145)
TOTAL PROTEIN: 7.6 g/dL (ref 6.0–8.3)

## 2017-07-23 LAB — CBC WITH DIFFERENTIAL/PLATELET
BASOS ABS: 0.1 10*3/uL (ref 0.0–0.1)
Basophils Relative: 1.4 % (ref 0.0–3.0)
EOS PCT: 1.2 % (ref 0.0–5.0)
Eosinophils Absolute: 0.1 10*3/uL (ref 0.0–0.7)
HCT: 39.2 % (ref 39.0–52.0)
HEMOGLOBIN: 13.4 g/dL (ref 13.0–17.0)
LYMPHS ABS: 1.4 10*3/uL (ref 0.7–4.0)
Lymphocytes Relative: 23.8 % (ref 12.0–46.0)
MCHC: 34.2 g/dL (ref 30.0–36.0)
MCV: 104.6 fl — ABNORMAL HIGH (ref 78.0–100.0)
MONO ABS: 0.5 10*3/uL (ref 0.1–1.0)
Monocytes Relative: 8.9 % (ref 3.0–12.0)
NEUTROS PCT: 64.7 % (ref 43.0–77.0)
Neutro Abs: 3.8 10*3/uL (ref 1.4–7.7)
Platelets: 186 10*3/uL (ref 150.0–400.0)
RBC: 3.74 Mil/uL — AB (ref 4.22–5.81)
RDW: 12.4 % (ref 11.5–15.5)
WBC: 5.9 10*3/uL (ref 4.0–10.5)

## 2017-07-23 LAB — PROTIME-INR
INR: 1.1 ratio — ABNORMAL HIGH (ref 0.8–1.0)
PROTHROMBIN TIME: 11.4 s (ref 9.6–13.1)

## 2017-07-23 MED ORDER — PEG 3350-KCL-NA BICARB-NACL 420 G PO SOLR: 4000.0000 mL | Freq: Once | ORAL | 0 refills | Status: AC
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
## 2017-07-23 NOTE — Patient Instructions (Addendum)
We will get records sent from your previous gastroenterologist Dr. Earlean Shawl (two colonoscopies in the past 10 years or so) for review.  This will include any endoscopic (colonoscopy or upper endoscopy) procedures and any associated pathology reports.    You will be set up for a colonoscopy for abnormal colon on CT and ?history of "90 polyps" per Dr. Earlean Shawl many years ago.  You will be set up for an upper endoscopy for cirrhosis, screen for varices.  You will have labs checked today in the basement lab.  Please head down after you check out with the front desk  (cbc, cmet, inr, Hepatitis C Ab, Hepaititis C genotype).  Normal BMI (Body Mass Index- based on height and weight) is between 23 and 30. Your BMI today is Body mass index is 26.98 kg/m. Marland Kitchen Please consider follow up  regarding your BMI with your Primary Care Provider.

## 2017-07-28 LAB — HCV RNA,QUANTITATIVE REAL TIME PCR
HCV Quantitative Log: 5.87 Log IU/mL — ABNORMAL HIGH
HCV RNA, PCR, QN: 746000 IU/mL — ABNORMAL HIGH

## 2017-07-28 LAB — HEPATITIS C ANTIBODY
Hepatitis C Ab: REACTIVE — AB
SIGNAL TO CUT-OFF: 27.1 — ABNORMAL HIGH (ref <1.00)

## 2017-07-28 LAB — HEPATITIS C GENOTYPE

## 2017-08-04 ENCOUNTER — Telehealth: Payer: Self-pay | Admitting: Gastroenterology

## 2017-08-04 NOTE — Telephone Encounter (Signed)
Please call the patient. Labs show his cirrhosis is well compensated. He still has Hep C (genotype 1a). After colonoscopy/EGD I'll plan to refer him to ID clinic to consider hep C eradication (previously failed interferon regimen many years ago).

## 2017-08-04 NOTE — Telephone Encounter (Signed)
The pt has been advised and will keep the appt for procedures

## 2017-09-03 ENCOUNTER — Ambulatory Visit (AMBULATORY_SURGERY_CENTER): Payer: 59 | Admitting: Gastroenterology

## 2017-09-03 ENCOUNTER — Encounter: Payer: Self-pay | Admitting: Gastroenterology

## 2017-09-03 ENCOUNTER — Other Ambulatory Visit: Payer: Self-pay

## 2017-09-03 VITALS — BP 157/59 | HR 63 | Temp 98.6°F | Resp 22 | Ht 70.0 in | Wt 188.0 lb

## 2017-09-03 DIAGNOSIS — K2970 Gastritis, unspecified, without bleeding: Secondary | ICD-10-CM | POA: Diagnosis not present

## 2017-09-03 DIAGNOSIS — K449 Diaphragmatic hernia without obstruction or gangrene: Secondary | ICD-10-CM

## 2017-09-03 DIAGNOSIS — K2990 Gastroduodenitis, unspecified, without bleeding: Secondary | ICD-10-CM

## 2017-09-03 DIAGNOSIS — Z86010 Personal history of colonic polyps: Secondary | ICD-10-CM | POA: Diagnosis not present

## 2017-09-03 DIAGNOSIS — D125 Benign neoplasm of sigmoid colon: Secondary | ICD-10-CM

## 2017-09-03 DIAGNOSIS — D120 Benign neoplasm of cecum: Secondary | ICD-10-CM | POA: Diagnosis not present

## 2017-09-03 DIAGNOSIS — K5730 Diverticulosis of large intestine without perforation or abscess without bleeding: Secondary | ICD-10-CM

## 2017-09-03 DIAGNOSIS — D124 Benign neoplasm of descending colon: Secondary | ICD-10-CM

## 2017-09-03 DIAGNOSIS — K7460 Unspecified cirrhosis of liver: Secondary | ICD-10-CM | POA: Diagnosis present

## 2017-09-03 DIAGNOSIS — D123 Benign neoplasm of transverse colon: Secondary | ICD-10-CM | POA: Diagnosis not present

## 2017-09-03 MED ORDER — SODIUM CHLORIDE 0.9 % IV SOLN: 500.0000 mL | INTRAVENOUS | Status: DC

## 2017-09-03 NOTE — Op Note (Signed)
[facility] Patient Name: Malik Fleming Procedure Date: 09/03/2017 1:33 PM MRN: [id number] Endoscopist: Milus Banister , MD Age: 67 Referring MD:  Date of Birth: 17-Feb-1951 Gender: Male Account #: [id number] Procedure:                Colonoscopy Indications:              High risk colon cancer surveillance: Personal                            history of colonic polyps; per patient, Dr. Earlean Shawl                            told him he had 22 polyps in his colon during                            colonoscopy several years ago. We were unable to                            gather those records. Medicines:                Monitored Anesthesia Care Procedure:                Pre-Anesthesia Assessment:                           - Prior to the procedure, a History and Physical                            was performed, and patient medications and                            allergies were reviewed. The patient's tolerance of                            previous anesthesia was also reviewed. The risks                            and benefits of the procedure and the sedation                            options and risks were discussed with the patient.                            All questions were answered, and informed consent                            was obtained. Prior Anticoagulants: The patient has                            taken no previous anticoagulant or antiplatelet                            agents. ASA Grade Assessment: III - A patient with  severe systemic disease. After reviewing the risks                            and benefits, the patient was deemed in                            satisfactory condition to undergo the procedure.                           After obtaining informed consent, the colonoscope                            was passed under direct vision. Throughout the                            procedure, the patient's blood pressure, pulse,  and                            oxygen saturations were monitored continuously. The                            Colonoscope was introduced through the anus and                            advanced to the the cecum, identified by                            appendiceal orifice and ileocecal valve. The                            colonoscopy was performed without difficulty. The                            patient tolerated the procedure well. The quality                            of the bowel preparation was good. The ileocecal                            valve, appendiceal orifice, and rectum were                            photographed. Scope In: 1:39:19 PM Scope Out: 2:06:54 PM Scope Withdrawal Time: 0 hours 24 minutes 17 seconds  Total Procedure Duration: 0 hours 27 minutes 35 seconds  Findings:                 Six sessile polyps were found in the descending                            colon, transverse colon and cecum. The polyps were                            2 to 6 mm in size. These polyps were removed with a  cold snare. Resection and retrieval were complete.                            (jar 1)                           A 12 mm polyp was found in the sigmoid colon. The                            polyp was pedunculated and slightly firm. The polyp                            was removed with a hot snare. Resection and                            retrieval were complete. Area was tattooed with an                            injection of [place] ink. (jar 2)                           A 4 mm polyp was found in the distal sigmoid colon.                            The polyp was semi-pedunculated. The polyp was                            removed with a cold snare. Resection and retrieval                            were complete. (jar 3)                           Multiple small and large-mouthed diverticula were                            found in the left colon.                            The exam was otherwise without abnormality on                            direct and retroflexion views. Complications:            No immediate complications. Estimated blood loss:                            None. Estimated Blood Loss:     Estimated blood loss: none. Impression:               - Six 2 to 6 mm polyps in the descending colon, in                            the transverse colon and in the cecum, removed with  a cold snare. Resected and retrieved.                           - One 12 mm polyp in the sigmoid colon, removed                            with a hot snare. Resected and retrieved. Tattooed.                            (This polyp was slightly firm, suspicious for                            advanced neoplasm).                           - One 4 mm polyp in the distal sigmoid colon,                            removed with a cold snare. Resected and retrieved.                           - Diverticulosis in the left colon.                           - The examination was otherwise normal on direct                            and retroflexion views. Recommendation:           - Patient has a contact number available for                            emergencies. The signs and symptoms of potential                            delayed complications were discussed with the                            patient. Return to normal activities tomorrow.                            Written discharge instructions were provided to the                            patient.                           - Resume previous diet.                           - Continue present medications.                           - Repeat colonoscopy is recommended. The  colonoscopy date will be determined after pathology                            results from today's exam become available for                            review.                           - EGD to  screen for varices.                           You will receive a letter within 2-3 weeks with the                            pathology results and my final recommendations. Milus Banister, MD 09/03/2017 2:18:34 PM This report has been signed electronically.

## 2017-09-03 NOTE — Progress Notes (Signed)
Report given to PACU, vss 

## 2017-09-03 NOTE — Patient Instructions (Signed)
Information on polyps  YOU HAD AN ENDOSCOPIC PROCEDURE TODAY AT [facility]:   Refer to the procedure report that was given to you for any specific questions about what was found during the examination.  If the procedure report does not answer your questions, please call your gastroenterologist to clarify.  If you requested that your care partner not be given the details of your procedure findings, then the procedure report has been included in a sealed envelope for you to review at your convenience later.  YOU SHOULD EXPECT: Some feelings of bloating in the abdomen. Passage of more gas than usual.  Walking can help get rid of the air that was put into your GI tract during the procedure and reduce the bloating. If you had a lower endoscopy (such as a colonoscopy or flexible sigmoidoscopy) you may notice spotting of blood in your stool or on the toilet paper. If you underwent a bowel prep for your procedure, you may not have a normal bowel movement for a few days.  Please Note:  You might notice some irritation and congestion in your nose or some drainage.  This is from the oxygen used during your procedure.  There is no need for concern and it should clear up in a day or so.  SYMPTOMS TO REPORT IMMEDIATELY:   Following lower endoscopy (colonoscopy or flexible sigmoidoscopy):  Excessive amounts of blood in the stool  Significant tenderness or worsening of abdominal pains  Swelling of the abdomen that is new, acute  Fever of 100F or higher   Following upper endoscopy (EGD)  Vomiting of blood or coffee ground material  New chest pain or pain under the shoulder blades  Painful or persistently difficult swallowing  New shortness of breath  Fever of 100F or higher  Black, tarry-looking stools  For urgent or emergent issues, a gastroenterologist can be reached at any hour by calling [phone].   DIET:  We do recommend a small meal at first, but then you may proceed to  your regular diet.  Drink plenty of fluids but you should avoid alcoholic beverages for 24 hours.  ACTIVITY:  You should plan to take it easy for the rest of today and you should NOT DRIVE or use heavy machinery until tomorrow (because of the sedation medicines used during the test).    FOLLOW UP: Our staff will call the number listed on your records the next business day following your procedure to check on you and address any questions or concerns that you may have regarding the information given to you following your procedure. If we do not reach you, we will leave a message.  However, if you are feeling well and you are not experiencing any problems, there is no need to return our call.  We will assume that you have returned to your regular daily activities without incident.  If any biopsies were taken you will be contacted by phone or by letter within the next 1-3 weeks.  Please call us at [phone] if you have not heard about the biopsies in 3 weeks.    SIGNATURES/CONFIDENTIALITY: You and/or your care partner have signed paperwork which will be entered into your electronic medical record.  These signatures attest to the fact that that the information above on your After Visit Summary has been reviewed and is understood.  Full responsibility of the confidentiality of this discharge information lies with you and/or your care-partner.

## 2017-09-03 NOTE — Op Note (Signed)
[facility] Patient Name: Malik Fleming Procedure Date: 09/03/2017 1:33 PM MRN: [id number] Endoscopist: Milus Banister , MD Age: 67 Referring MD:  Date of Birth: [birth year] Gender: Male Account #: [id number] Procedure:                Upper GI endoscopy Indications:              Cirrhosis rule out esophageal varices Medicines:                Monitored Anesthesia Care Procedure:                Pre-Anesthesia Assessment:                           - Prior to the procedure, a History and Physical                            was performed, and patient medications and                            allergies were reviewed. The patient's tolerance of                            previous anesthesia was also reviewed. The risks                            and benefits of the procedure and the sedation                            options and risks were discussed with the patient.                            All questions were answered, and informed consent                            was obtained. Prior Anticoagulants: The patient has                            taken no previous anticoagulant or antiplatelet                            agents. ASA Grade Assessment: III - A patient with                            severe systemic disease. After reviewing the risks                            and benefits, the patient was deemed in                            satisfactory condition to undergo the procedure.                           After obtaining informed consent, the endoscope was  passed under direct vision. Throughout the                            procedure, the patient's blood pressure, pulse, and                            oxygen saturations were monitored continuously. The                            Model GIF-HQ190 [id number]) scope was introduced                            through the mouth, and advanced to the second part                            of duodenum.  The upper GI endoscopy was                            accomplished without difficulty. The patient                            tolerated the procedure well. Scope In: Scope Out: Findings:                 A medium-sized hiatal hernia was present with usual                            resultant tortuous and foreshortened esophagus.                           There was mild, non-specific gastritis.                           No signs of portal hypertension. Complications:            No immediate complications. Estimated blood loss:                            None. Estimated Blood Loss:     Estimated blood loss: none. Impression:               - Medium-sized hiatal hernia.                           - No signs of portal hypertension. Recommendation:           - Patient has a contact number available for                            emergencies. The signs and symptoms of potential                            delayed complications were discussed with the                            patient. Return to normal activities tomorrow.  Written discharge instructions were provided to the                            patient.                           - Resume previous diet.                           - Continue present medications.                           - Repeat upper endoscopy in 3 years for                            surveillance.                           - Pending path review of colon polyps, will                            possibly refer you to ID clinic to consider                            hepatitis C treatmen with new agents. Milus Banister, MD 09/03/2017 2:22:33 PM This report has been signed electronically.

## 2017-09-03 NOTE — Progress Notes (Signed)
Called to room to assist during endoscopic procedure.  Patient ID and intended procedure confirmed with present staff. Received instructions for my participation in the procedure from the performing physician.  

## 2017-09-04 ENCOUNTER — Telehealth: Payer: Self-pay

## 2017-09-04 NOTE — Telephone Encounter (Signed)
  Follow up Call-  Call back number 09/03/2017  Post procedure Call Back phone  # [phone]  Permission to leave phone message Yes  Some recent data might be hidden     Patient questions:  Do you have a fever, pain , or abdominal swelling? No. Pain Score  0 *  Have you tolerated food without any problems? Yes.    Have you been able to return to your normal activities? Yes.    Do you have any questions about your discharge instructions: Diet   No. Medications  No. Follow up visit  No.  Do you have questions or concerns about your Care? No.  Actions: * If pain score is 4 or above: No action needed, pain <4.

## 2017-09-15 ENCOUNTER — Other Ambulatory Visit: Payer: Self-pay

## 2017-09-15 DIAGNOSIS — K7460 Unspecified cirrhosis of liver: Secondary | ICD-10-CM

## 2017-09-30 ENCOUNTER — Emergency Department (HOSPITAL_COMMUNITY)
Admission: EM | Admit: 2017-09-30 | Discharge: 2017-10-01 | Disposition: A | Discharge status: Home / Self Care | Payer: 59 | Attending: Emergency Medicine | Admitting: Emergency Medicine

## 2017-09-30 ENCOUNTER — Encounter (HOSPITAL_COMMUNITY): Payer: Self-pay | Admitting: Emergency Medicine

## 2017-09-30 ENCOUNTER — Emergency Department (HOSPITAL_COMMUNITY): Payer: 59

## 2017-09-30 DIAGNOSIS — J45998 Other asthma: Secondary | ICD-10-CM | POA: Insufficient documentation

## 2017-09-30 DIAGNOSIS — F17210 Nicotine dependence, cigarettes, uncomplicated: Secondary | ICD-10-CM | POA: Diagnosis not present

## 2017-09-30 DIAGNOSIS — Z79899 Other long term (current) drug therapy: Secondary | ICD-10-CM | POA: Insufficient documentation

## 2017-09-30 DIAGNOSIS — K5792 Diverticulitis of intestine, part unspecified, without perforation or abscess without bleeding: Secondary | ICD-10-CM

## 2017-09-30 DIAGNOSIS — K5790 Diverticulosis of intestine, part unspecified, without perforation or abscess without bleeding: Secondary | ICD-10-CM | POA: Diagnosis not present

## 2017-09-30 DIAGNOSIS — R1031 Right lower quadrant pain: Secondary | ICD-10-CM | POA: Diagnosis present

## 2017-09-30 LAB — COMPREHENSIVE METABOLIC PANEL
ALT: 24 U/L (ref 17–63)
AST: 32 U/L (ref 15–41)
Albumin: 3.9 g/dL (ref 3.5–5.0)
Alkaline Phosphatase: 81 U/L (ref 38–126)
Anion gap: 14 (ref 5–15)
BUN: 8 mg/dL (ref 6–20)
CO2: 22 mmol/L (ref 22–32)
Calcium: 9.3 mg/dL (ref 8.9–10.3)
Chloride: 93 mmol/L — ABNORMAL LOW (ref 101–111)
Creatinine, Ser: 0.67 mg/dL (ref 0.61–1.24)
GFR calc Af Amer: >60 mL/min (ref >60)
GFR calc non Af Amer: >60 mL/min (ref >60)
Glucose, Bld: 118 mg/dL — ABNORMAL HIGH (ref 65–99)
Potassium: 3.5 mmol/L (ref 3.5–5.1)
Sodium: 129 mmol/L — ABNORMAL LOW (ref 135–145)
Total Bilirubin: 1 mg/dL (ref 0.3–1.2)
Total Protein: 7.7 g/dL (ref 6.5–8.1)

## 2017-09-30 LAB — CBC
HCT: 40.7 % (ref 39.0–52.0)
Hemoglobin: 14.6 g/dL (ref 13.0–17.0)
MCH: 35 pg — ABNORMAL HIGH (ref 26.0–34.0)
MCHC: 35.9 g/dL (ref 30.0–36.0)
MCV: 97.6 fL (ref 78.0–100.0)
Platelets: 251 10*3/uL (ref 150–400)
RBC: 4.17 MIL/uL — ABNORMAL LOW (ref 4.22–5.81)
RDW: 11.8 % (ref 11.5–15.5)
WBC: 12 10*3/uL — ABNORMAL HIGH (ref 4.0–10.5)

## 2017-09-30 LAB — LIPASE, BLOOD: Lipase: 48 U/L (ref 11–51)

## 2017-09-30 MED ORDER — HYDROMORPHONE HCL 2 MG/ML IJ SOLN
1.0000 mg | Freq: Once | INTRAMUSCULAR | Status: AC
  Administered 2017-09-30: 1 mg via INTRAVENOUS
  Filled 2017-09-30: qty 1

## 2017-09-30 MED ORDER — IOHEXOL 300 MG/ML  SOLN
100.0000 mL | Freq: Once | INTRAMUSCULAR | Status: AC | PRN
  Administered 2017-09-30: 100 mL via INTRAVENOUS

## 2017-09-30 MED ORDER — LORAZEPAM 2 MG/ML IJ SOLN
1.0000 mg | Freq: Once | INTRAMUSCULAR | Status: AC
  Administered 2017-09-30: 1 mg via INTRAVENOUS
  Filled 2017-09-30: qty 1

## 2017-09-30 NOTE — ED Notes (Signed)
Pt was called to be given a UA cup; no reply at 20:57.

## 2017-09-30 NOTE — ED Notes (Signed)
ED Provider at bedside. 

## 2017-09-30 NOTE — ED Triage Notes (Signed)
Pt BIB [facility] with c/o lower abdominal pain, nausea/vomiting/diarrhea x 2 days. Hx inguinal hernia. EMS vitals: BP 158/90, HR 62, RR 20, SpO2 94% ra

## 2017-09-30 NOTE — ED Notes (Signed)
Patient transported to CT 
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
## 2017-10-01 MED ORDER — CIPROFLOXACIN HCL 500 MG PO TABS: 500.0000 mg | ORAL_TABLET | Freq: Two times a day (BID) | ORAL | 0 refills | Status: DC

## 2017-10-01 MED ORDER — METRONIDAZOLE 500 MG PO TABS: 500.0000 mg | ORAL_TABLET | Freq: Three times a day (TID) | ORAL | 0 refills | Status: DC

## 2017-10-01 MED ORDER — CIPROFLOXACIN HCL 500 MG PO TABS
500.0000 mg | ORAL_TABLET | Freq: Once | ORAL | Status: AC
  Administered 2017-10-01: 500 mg via ORAL
  Filled 2017-10-01: qty 1

## 2017-10-01 MED ORDER — METRONIDAZOLE 500 MG PO TABS
500.0000 mg | ORAL_TABLET | Freq: Once | ORAL | Status: AC
  Administered 2017-10-01: 500 mg via ORAL
  Filled 2017-10-01: qty 1

## 2017-10-01 MED ORDER — HYDROMORPHONE HCL 2 MG/ML IJ SOLN
1.0000 mg | Freq: Once | INTRAMUSCULAR | Status: AC
  Administered 2017-10-01: 1 mg via INTRAVENOUS
  Filled 2017-10-01: qty 1

## 2017-11-26 ENCOUNTER — Other Ambulatory Visit: Payer: Self-pay

## 2017-11-27 ENCOUNTER — Other Ambulatory Visit: Payer: Self-pay

## 2017-11-27 ENCOUNTER — Encounter (HOSPITAL_COMMUNITY): Payer: Self-pay | Admitting: *Deleted

## 2017-11-27 ENCOUNTER — Emergency Department (HOSPITAL_COMMUNITY)
Admission: EM | Admit: 2017-11-27 | Discharge: 2017-11-28 | Disposition: A | Discharge status: Home / Self Care | Payer: 59 | Attending: Emergency Medicine | Admitting: Emergency Medicine

## 2017-11-27 DIAGNOSIS — J449 Chronic obstructive pulmonary disease, unspecified: Secondary | ICD-10-CM | POA: Insufficient documentation

## 2017-11-27 DIAGNOSIS — R1032 Left lower quadrant pain: Secondary | ICD-10-CM | POA: Diagnosis not present

## 2017-11-27 DIAGNOSIS — R1031 Right lower quadrant pain: Secondary | ICD-10-CM | POA: Insufficient documentation

## 2017-11-27 DIAGNOSIS — R112 Nausea with vomiting, unspecified: Secondary | ICD-10-CM | POA: Insufficient documentation

## 2017-11-27 DIAGNOSIS — G8929 Other chronic pain: Secondary | ICD-10-CM | POA: Insufficient documentation

## 2017-11-27 DIAGNOSIS — F17210 Nicotine dependence, cigarettes, uncomplicated: Secondary | ICD-10-CM | POA: Insufficient documentation

## 2017-11-27 DIAGNOSIS — R109 Unspecified abdominal pain: Secondary | ICD-10-CM

## 2017-11-27 DIAGNOSIS — Z79899 Other long term (current) drug therapy: Secondary | ICD-10-CM | POA: Insufficient documentation

## 2017-11-27 MED ORDER — SODIUM CHLORIDE 0.9 % IV BOLUS
1000.0000 mL | Freq: Once | INTRAVENOUS | Status: AC
  Administered 2017-11-27: 1000 mL via INTRAVENOUS

## 2017-11-27 MED ORDER — HYDROMORPHONE HCL 1 MG/ML IJ SOLN
1.0000 mg | Freq: Once | INTRAMUSCULAR | Status: AC
  Administered 2017-11-28: 1 mg via INTRAVENOUS
  Filled 2017-11-27: qty 1

## 2017-11-27 MED ORDER — ONDANSETRON HCL 4 MG/2ML IJ SOLN
4.0000 mg | Freq: Once | INTRAMUSCULAR | Status: AC
  Administered 2017-11-27: 4 mg via INTRAVENOUS
  Filled 2017-11-27: qty 2

## 2017-11-27 MED ORDER — PROMETHAZINE HCL 25 MG/ML IJ SOLN
12.5000 mg | Freq: Once | INTRAMUSCULAR | Status: AC
  Administered 2017-11-28: 12.5 mg via INTRAVENOUS
  Filled 2017-11-27: qty 1

## 2017-11-27 NOTE — ED Notes (Signed)
Clear yellow emesis in triage.

## 2017-11-27 NOTE — ED Notes (Signed)
ED Provider at bedside. 

## 2017-11-27 NOTE — ED Triage Notes (Signed)
Pt reports upper abdominal pain that started suddenly in the upper abdomen, he has had several episodes of vomiting with bright red blood, also has had diarrhea (denies blood in his stool). No meds PTA.

## 2017-11-28 ENCOUNTER — Emergency Department (HOSPITAL_COMMUNITY): Payer: 59

## 2017-11-28 LAB — CBC
HEMATOCRIT: 45.3 % (ref 39.0–52.0)
Hemoglobin: 14.9 g/dL (ref 13.0–17.0)
MCH: 34.3 pg — ABNORMAL HIGH (ref 26.0–34.0)
MCHC: 32.9 g/dL (ref 30.0–36.0)
MCV: 104.4 fL — AB (ref 78.0–100.0)
PLATELETS: 197 10*3/uL (ref 150–400)
RBC: 4.34 MIL/uL (ref 4.22–5.81)
RDW: 13.8 % (ref 11.5–15.5)
WBC: 8.4 10*3/uL (ref 4.0–10.5)

## 2017-11-28 LAB — COMPREHENSIVE METABOLIC PANEL
ALT: 23 U/L (ref 0–44)
AST: 42 U/L — ABNORMAL HIGH (ref 15–41)
Albumin: 4 g/dL (ref 3.5–5.0)
Alkaline Phosphatase: 76 U/L (ref 38–126)
Anion gap: 12 (ref 5–15)
BUN: 6 mg/dL — ABNORMAL LOW (ref 8–23)
CHLORIDE: 106 mmol/L (ref 98–111)
CO2: 19 mmol/L — AB (ref 22–32)
CREATININE: 0.68 mg/dL (ref 0.61–1.24)
Calcium: 8.8 mg/dL — ABNORMAL LOW (ref 8.9–10.3)
GFR calc Af Amer: >60 mL/min (ref >60)
Glucose, Bld: 102 mg/dL — ABNORMAL HIGH (ref 70–99)
POTASSIUM: 4.2 mmol/L (ref 3.5–5.1)
SODIUM: 137 mmol/L (ref 135–145)
Total Bilirubin: 1.2 mg/dL (ref 0.3–1.2)
Total Protein: 7.1 g/dL (ref 6.5–8.1)

## 2017-11-28 LAB — RAPID URINE DRUG SCREEN, HOSP PERFORMED
AMPHETAMINES: NOT DETECTED
BENZODIAZEPINES: NOT DETECTED
COCAINE: NOT DETECTED
Opiates: POSITIVE — AB
TETRAHYDROCANNABINOL: NOT DETECTED

## 2017-11-28 LAB — URINALYSIS, ROUTINE W REFLEX MICROSCOPIC
Bilirubin Urine: NEGATIVE
Glucose, UA: NEGATIVE mg/dL
Hgb urine dipstick: NEGATIVE
Ketones, ur: 20 mg/dL — AB
LEUKOCYTES UA: NEGATIVE
NITRITE: NEGATIVE
PH: 6 (ref 5.0–8.0)
Protein, ur: NEGATIVE mg/dL
SPECIFIC GRAVITY, URINE: 1.013 (ref 1.005–1.030)

## 2017-11-28 LAB — I-STAT CG4 LACTIC ACID, ED
Lactic Acid, Venous: 1.28 mmol/L (ref 0.5–1.9)
Lactic Acid, Venous: 2.12 mmol/L (ref 0.5–1.9)

## 2017-11-28 LAB — LIPASE, BLOOD: LIPASE: 48 U/L (ref 11–51)

## 2017-11-28 MED ORDER — DICYCLOMINE HCL 10 MG/ML IM SOLN
20.0000 mg | Freq: Once | INTRAMUSCULAR | Status: AC
  Administered 2017-11-28: 20 mg via INTRAMUSCULAR
  Filled 2017-11-28: qty 2

## 2017-11-28 MED ORDER — DICYCLOMINE HCL 20 MG PO TABS: 20.0000 mg | ORAL_TABLET | Freq: Two times a day (BID) | ORAL | 0 refills | Status: DC

## 2017-11-28 MED ORDER — HYDROMORPHONE HCL 1 MG/ML IJ SOLN
1.0000 mg | Freq: Once | INTRAMUSCULAR | Status: AC
  Administered 2017-11-28: 1 mg via INTRAVENOUS
  Filled 2017-11-28: qty 1

## 2017-11-28 MED ORDER — HALOPERIDOL LACTATE 5 MG/ML IJ SOLN
2.0000 mg | Freq: Once | INTRAMUSCULAR | Status: AC
  Administered 2017-11-28: 2 mg via INTRAVENOUS
  Filled 2017-11-28: qty 1

## 2017-11-28 MED ORDER — PROMETHAZINE HCL 25 MG PO TABS: 25.0000 mg | ORAL_TABLET | Freq: Four times a day (QID) | ORAL | 0 refills | Status: DC | PRN

## 2017-11-28 MED ORDER — KETOROLAC TROMETHAMINE 30 MG/ML IJ SOLN
30.0000 mg | Freq: Once | INTRAMUSCULAR | Status: AC
  Administered 2017-11-28: 30 mg via INTRAVENOUS
  Filled 2017-11-28: qty 1

## 2017-11-28 MED ORDER — SODIUM CHLORIDE 0.9 % IV BOLUS
1000.0000 mL | Freq: Once | INTRAVENOUS | Status: AC
  Administered 2017-11-28: 1000 mL via INTRAVENOUS

## 2017-11-28 NOTE — Discharge Instructions (Signed)
Continue your daily medications.  We recommend Bentyl as prescribed for abdominal pain or cramping.  Take Phenergan as needed for nausea.  Follow-up with your gastroenterologist as well as your primary care doctor regarding your visit.
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
## 2017-11-28 NOTE — ED Notes (Signed)
ED Provider at bedside. 

## 2017-11-28 NOTE — ED Notes (Signed)
Writer notified PA Humes of abnormal I stat lactic result.

## 2017-11-28 NOTE — ED Notes (Signed)
Patient transported to X-ray 

## 2017-11-28 NOTE — ED Notes (Signed)
Pt confirmed wife will pick him up, pt to wait in lobby.

## 2018-01-12 ENCOUNTER — Emergency Department (HOSPITAL_COMMUNITY): Payer: 59

## 2018-01-12 ENCOUNTER — Emergency Department (HOSPITAL_COMMUNITY)
Admission: EM | Admit: 2018-01-12 | Discharge: 2018-01-12 | Disposition: A | Discharge status: Home / Self Care | Payer: 59 | Attending: Emergency Medicine | Admitting: Emergency Medicine

## 2018-01-12 ENCOUNTER — Encounter (HOSPITAL_COMMUNITY): Payer: Self-pay | Admitting: Emergency Medicine

## 2018-01-12 ENCOUNTER — Other Ambulatory Visit: Payer: Self-pay

## 2018-01-12 DIAGNOSIS — R1031 Right lower quadrant pain: Secondary | ICD-10-CM | POA: Insufficient documentation

## 2018-01-12 DIAGNOSIS — R51 Headache: Secondary | ICD-10-CM | POA: Diagnosis not present

## 2018-01-12 DIAGNOSIS — F17210 Nicotine dependence, cigarettes, uncomplicated: Secondary | ICD-10-CM | POA: Diagnosis not present

## 2018-01-12 DIAGNOSIS — J449 Chronic obstructive pulmonary disease, unspecified: Secondary | ICD-10-CM | POA: Diagnosis not present

## 2018-01-12 DIAGNOSIS — R1030 Lower abdominal pain, unspecified: Secondary | ICD-10-CM | POA: Diagnosis not present

## 2018-01-12 DIAGNOSIS — R519 Headache, unspecified: Secondary | ICD-10-CM

## 2018-01-12 DIAGNOSIS — R42 Dizziness and giddiness: Secondary | ICD-10-CM | POA: Diagnosis not present

## 2018-01-12 LAB — COMPREHENSIVE METABOLIC PANEL
ALBUMIN: 4 g/dL (ref 3.5–5.0)
ALT: 21 U/L (ref 0–44)
ANION GAP: 11 (ref 5–15)
AST: 24 U/L (ref 15–41)
Alkaline Phosphatase: 91 U/L (ref 38–126)
BUN: <5 mg/dL — ABNORMAL LOW (ref 8–23)
CO2: 23 mmol/L (ref 22–32)
Calcium: 9.6 mg/dL (ref 8.9–10.3)
Chloride: 102 mmol/L (ref 98–111)
Creatinine, Ser: 0.73 mg/dL (ref 0.61–1.24)
GFR calc Af Amer: >60 mL/min (ref >60)
GFR calc non Af Amer: >60 mL/min (ref >60)
GLUCOSE: 115 mg/dL — AB (ref 70–99)
POTASSIUM: 3.7 mmol/L (ref 3.5–5.1)
SODIUM: 136 mmol/L (ref 135–145)
Total Bilirubin: 1 mg/dL (ref 0.3–1.2)
Total Protein: 7.9 g/dL (ref 6.5–8.1)

## 2018-01-12 LAB — CBC
HEMATOCRIT: 45.9 % (ref 39.0–52.0)
HEMOGLOBIN: 15.9 g/dL (ref 13.0–17.0)
MCH: 35.3 pg — AB (ref 26.0–34.0)
MCHC: 34.6 g/dL (ref 30.0–36.0)
MCV: 101.8 fL — AB (ref 78.0–100.0)
Platelets: 227 10*3/uL (ref 150–400)
RBC: 4.51 MIL/uL (ref 4.22–5.81)
RDW: 12.6 % (ref 11.5–15.5)
WBC: 7.4 10*3/uL (ref 4.0–10.5)

## 2018-01-12 LAB — I-STAT TROPONIN, ED: Troponin i, poc: 0.01 ng/mL (ref 0.00–0.08)

## 2018-01-12 LAB — LIPASE, BLOOD: Lipase: 43 U/L (ref 11–51)

## 2018-01-12 LAB — I-STAT CG4 LACTIC ACID, ED: Lactic Acid, Venous: 1.09 mmol/L (ref 0.5–1.9)

## 2018-01-12 MED ORDER — METOCLOPRAMIDE HCL 5 MG/ML IJ SOLN
10.0000 mg | Freq: Once | INTRAMUSCULAR | Status: AC
  Administered 2018-01-12: 10 mg via INTRAVENOUS
  Filled 2018-01-12: qty 2

## 2018-01-12 MED ORDER — GI COCKTAIL ~~LOC~~
30.0000 mL | Freq: Once | ORAL | Status: AC
  Administered 2018-01-12: 30 mL via ORAL
  Filled 2018-01-12: qty 30

## 2018-01-12 MED ORDER — FENTANYL CITRATE (PF) 100 MCG/2ML IJ SOLN
50.0000 ug | Freq: Once | INTRAMUSCULAR | Status: AC
  Administered 2018-01-12: 50 ug via INTRAVENOUS
  Filled 2018-01-12: qty 2

## 2018-01-12 MED ORDER — LACTATED RINGERS IV BOLUS
1000.0000 mL | Freq: Once | INTRAVENOUS | Status: AC
  Administered 2018-01-12: 1000 mL via INTRAVENOUS

## 2018-01-12 MED ORDER — IOHEXOL 300 MG/ML  SOLN
100.0000 mL | Freq: Once | INTRAMUSCULAR | Status: AC | PRN
  Administered 2018-01-12: 100 mL via INTRAVENOUS

## 2018-01-12 MED ORDER — ONDANSETRON HCL 4 MG/2ML IJ SOLN
4.0000 mg | Freq: Once | INTRAMUSCULAR | Status: AC
  Administered 2018-01-12: 4 mg via INTRAVENOUS
  Filled 2018-01-12: qty 2

## 2018-01-12 MED ORDER — MORPHINE SULFATE (PF) 4 MG/ML IV SOLN
4.0000 mg | Freq: Once | INTRAVENOUS | Status: AC
  Administered 2018-01-12: 4 mg via INTRAVENOUS
  Filled 2018-01-12: qty 1

## 2018-01-12 MED ORDER — METOCLOPRAMIDE HCL 5 MG/ML IJ SOLN
10.0000 mg | Freq: Once | INTRAMUSCULAR | Status: DC
  Filled 2018-01-12: qty 2

## 2018-01-12 NOTE — ED Notes (Signed)
Pt to room 34, met by Xray, transported to Xray. 

## 2018-01-12 NOTE — Discharge Instructions (Signed)
Malik Fleming:  Thank you for allowing [place] to take care of you today.  We hope you begin feeling better soon.  To-Do: Please follow-up with Surgery using the appointment contact information below Please take tylenol or motrin for your pain Please return to the Emergency Department or call 911 if you experience chest pain, shortness of breath, severe pain, severe fever, altered mental status, or have any reason to think that you need emergency medical care.  Thank you again.  Hope you feel better soon.

## 2018-01-12 NOTE — ED Triage Notes (Signed)
Pt reports 10/10 mid stabbing abd pain and throbbing headache that started this am. Denies N/V/D. Pt reports falling yesterday in the tub without injuries, denies hitting head and denies blood thinners.

## 2018-01-12 NOTE — ED Notes (Signed)
Wait on room per RN, plz

## 2018-01-12 NOTE — ED Notes (Signed)
ED Provider at bedside. 

## 2018-01-12 NOTE — ED Notes (Signed)
While in with Pt hooking Pt up to Heart Monitor Pt blacked out in a shaking and gagging like event. Event only lasted about 15 seconds. Pt came out of it and did not remember the event but stated it has happened a few times today.

## 2018-01-12 NOTE — ED Provider Notes (Signed)
MSE was initiated and I personally evaluated the patient and placed orders (if any) at  7:18 PM on January 12, 2018.  The patient appears stable so that the remainder of the MSE may be completed by another provider.  Patient placed in Quick Look pathway, seen and evaluated   Chief Complaint: Headache, lower abdominal pain.  HPI:   Patient is a 67 year old male with a history of OSA, diverticulosis, COPD, presenting for lower abdominal pain and headache.  Patient reports that they both began early this morning.  Patient reports that he is "dehydrated" has not urinated all day.  Patient reports last bowel movement today was normal for him without melena or hematochezia.  No nausea or vomiting.  Patient feels chilled, but no recorded fevers.  Patient reports that headache is frontal, throbbing, and without visual disturbance, speech disturbance, vertigo, or focal weakness or numbness.  ROS: See HPI (one)  Physical Exam:   Gen: Appears uncomfortable.  Neuro: Awake and Alert  Skin: Warm    Focused Exam: Pupils equal, round, and reactive to light.  Patient was all extremities symmetrically with good coordination.  Cranial nerves II through XII grossly intact.  Bowel sounds active in all 4 quadrants and epigastrium.  Patient has discomfort to percussion of lower abdomen.  Patient has right lower left lower and quadrant tenderness.   Initiation of care has begun. The patient has been counseled on the process, plan, and necessity for staying for the completion/evaluation, and the remainder of the medical screening examination    Tamala Julian 01/12/18 Wendall Papa, MD 01/14/18 [phone]

## 2018-01-12 NOTE — ED Notes (Signed)
Patient transported to CT 

## 2018-01-12 NOTE — ED Notes (Signed)
Pt taken to CT.
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
## 2018-02-02 ENCOUNTER — Other Ambulatory Visit: Payer: Self-pay

## 2018-02-02 ENCOUNTER — Encounter (HOSPITAL_COMMUNITY): Payer: Self-pay | Admitting: *Deleted

## 2018-02-02 ENCOUNTER — Emergency Department (HOSPITAL_COMMUNITY): Payer: 59

## 2018-02-02 ENCOUNTER — Emergency Department (HOSPITAL_COMMUNITY)
Admission: EM | Admit: 2018-02-02 | Discharge: 2018-02-02 | Disposition: A | Discharge status: Home / Self Care | Payer: 59 | Attending: Emergency Medicine | Admitting: Emergency Medicine

## 2018-02-02 DIAGNOSIS — R109 Unspecified abdominal pain: Secondary | ICD-10-CM

## 2018-02-02 DIAGNOSIS — K529 Noninfective gastroenteritis and colitis, unspecified: Secondary | ICD-10-CM

## 2018-02-02 DIAGNOSIS — R1031 Right lower quadrant pain: Secondary | ICD-10-CM | POA: Diagnosis present

## 2018-02-02 DIAGNOSIS — G8929 Other chronic pain: Secondary | ICD-10-CM | POA: Diagnosis not present

## 2018-02-02 DIAGNOSIS — F17210 Nicotine dependence, cigarettes, uncomplicated: Secondary | ICD-10-CM | POA: Diagnosis not present

## 2018-02-02 DIAGNOSIS — J449 Chronic obstructive pulmonary disease, unspecified: Secondary | ICD-10-CM | POA: Insufficient documentation

## 2018-02-02 DIAGNOSIS — R1013 Epigastric pain: Secondary | ICD-10-CM | POA: Diagnosis not present

## 2018-02-02 LAB — CBC
HCT: 43.5 % (ref 39.0–52.0)
HEMOGLOBIN: 15.3 g/dL (ref 13.0–17.0)
MCH: 35.9 pg — AB (ref 26.0–34.0)
MCHC: 35.2 g/dL (ref 30.0–36.0)
MCV: 102.1 fL — AB (ref 78.0–100.0)
PLATELETS: 179 10*3/uL (ref 150–400)
RBC: 4.26 MIL/uL (ref 4.22–5.81)
RDW: 13.6 % (ref 11.5–15.5)
WBC: 7.9 10*3/uL (ref 4.0–10.5)

## 2018-02-02 LAB — COMPREHENSIVE METABOLIC PANEL
ALK PHOS: 76 U/L (ref 38–126)
ALT: 22 U/L (ref 0–44)
AST: 26 U/L (ref 15–41)
Albumin: 4.4 g/dL (ref 3.5–5.0)
Anion gap: 11 (ref 5–15)
BUN: 10 mg/dL (ref 8–23)
CALCIUM: 9.7 mg/dL (ref 8.9–10.3)
CHLORIDE: 102 mmol/L (ref 98–111)
CO2: 25 mmol/L (ref 22–32)
CREATININE: 0.56 mg/dL — AB (ref 0.61–1.24)
Glucose, Bld: 142 mg/dL — ABNORMAL HIGH (ref 70–99)
Potassium: 3.9 mmol/L (ref 3.5–5.1)
SODIUM: 138 mmol/L (ref 135–145)
Total Bilirubin: 1.2 mg/dL (ref 0.3–1.2)
Total Protein: 7.8 g/dL (ref 6.5–8.1)

## 2018-02-02 LAB — LIPASE, BLOOD: Lipase: 40 U/L (ref 11–51)

## 2018-02-02 MED ORDER — PROMETHAZINE HCL 25 MG/ML IJ SOLN
12.5000 mg | Freq: Once | INTRAMUSCULAR | Status: AC
  Administered 2018-02-02: 12.5 mg via INTRAVENOUS
  Filled 2018-02-02: qty 1

## 2018-02-02 MED ORDER — ONDANSETRON HCL 4 MG/2ML IJ SOLN
4.0000 mg | Freq: Once | INTRAMUSCULAR | Status: AC
  Administered 2018-02-02: 4 mg via INTRAVENOUS
  Filled 2018-02-02: qty 2

## 2018-02-02 MED ORDER — GI COCKTAIL ~~LOC~~
30.0000 mL | Freq: Once | ORAL | Status: AC
  Administered 2018-02-02: 30 mL via ORAL
  Filled 2018-02-02: qty 30

## 2018-02-02 MED ORDER — SODIUM CHLORIDE 0.9 % IV BOLUS
500.0000 mL | Freq: Once | INTRAVENOUS | Status: AC
  Administered 2018-02-02: 500 mL via INTRAVENOUS

## 2018-02-02 MED ORDER — IOPAMIDOL (ISOVUE-300) INJECTION 61%
100.0000 mL | Freq: Once | INTRAVENOUS | Status: AC | PRN
  Administered 2018-02-02: 100 mL via INTRAVENOUS

## 2018-02-02 MED ORDER — ONDANSETRON 8 MG PO TBDP: 8.0000 mg | ORAL_TABLET | Freq: Three times a day (TID) | ORAL | 0 refills | Status: DC | PRN

## 2018-02-02 MED ORDER — IOPAMIDOL (ISOVUE-300) INJECTION 61%
INTRAVENOUS | Status: AC
  Filled 2018-02-02: qty 100

## 2018-02-02 MED ORDER — CIPROFLOXACIN HCL 500 MG PO TABS: 500.0000 mg | ORAL_TABLET | Freq: Two times a day (BID) | ORAL | 0 refills | Status: DC

## 2018-02-02 MED ORDER — METOCLOPRAMIDE HCL 5 MG/ML IJ SOLN
10.0000 mg | Freq: Once | INTRAMUSCULAR | Status: AC
  Administered 2018-02-02: 10 mg via INTRAVENOUS
  Filled 2018-02-02: qty 2

## 2018-02-02 MED ORDER — FENTANYL CITRATE (PF) 100 MCG/2ML IJ SOLN
50.0000 ug | Freq: Once | INTRAMUSCULAR | Status: AC
Start: 2018-02-02 — End: 2018-02-02
  Administered 2018-02-02: 50 ug via INTRAVENOUS
  Filled 2018-02-02: qty 2

## 2018-02-02 NOTE — ED Notes (Signed)
Patient given water at bedside. Encouraged patient to take small sips at a time. Patient c/o of abdominal pain.

## 2018-02-02 NOTE — ED Notes (Signed)
Bed: WA25 Expected date:  Expected time:  Means of arrival:  Comments: EMS  

## 2018-02-02 NOTE — ED Notes (Signed)
Ray,MD made aware of patients 10/10 right lower abdominal stabbing pain. No orders at this time. Will continue to monitor.

## 2018-02-02 NOTE — ED Triage Notes (Signed)
Pt bib EMS and coming from home.  Pt presents with right lower quadrant pain x 12 hours.  Pt reports nausea but no vomiting. Pt also has SOB with a hx of COPD. Pt's O2 sat was 95% RA for EMS but pt reported difficulty breathing and pt was put on 2L [place].   EMS has given pt 4mg  zofran and 154mcg fentanyl.

## 2018-02-02 NOTE — Discharge Instructions (Addendum)
Please drink clear fluids today Return if worse, including worsening pain, inability to tolerate fluids with nausea medication, fever, or other new symptoms Recheck with your doctor this week Follow up with Dr. Ardis Hughs
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
## 2018-02-28 IMAGING — CT CT ABD-PELV W/ CM
2 of 5 series · 16 of 46 positions shown, 18 images · IV contrast (ISOVUE)
Comparison: CT scan 04/08/2017

CLINICAL DATA: Abdominal pain today. Prior history of
diverticulitis. Nausea, vomiting and diarrhea since last evening.

EXAM:
CT ABDOMEN AND PELVIS WITH CONTRAST
TECHNIQUE: Multidetector CT imaging of the abdomen and pelvis was performed
using the standard protocol following bolus administration of
intravenous contrast.
CONTRAST:  100mL YS85LK-PMM IOPAMIDOL (YS85LK-PMM) INJECTION 61%

[Series 2: axial st · axial · 0.83mm/px · z∈[-460,-54]mm · 13 of 93 slices shown, 15 images]
[im 6/93  soft-tissue]
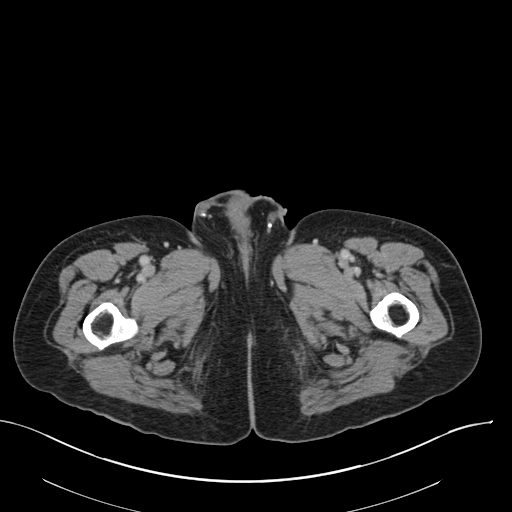
[im 6/93  bone]
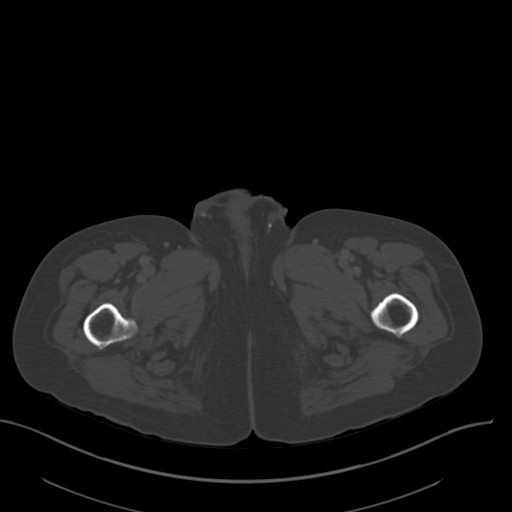
[im 12/93  soft-tissue]
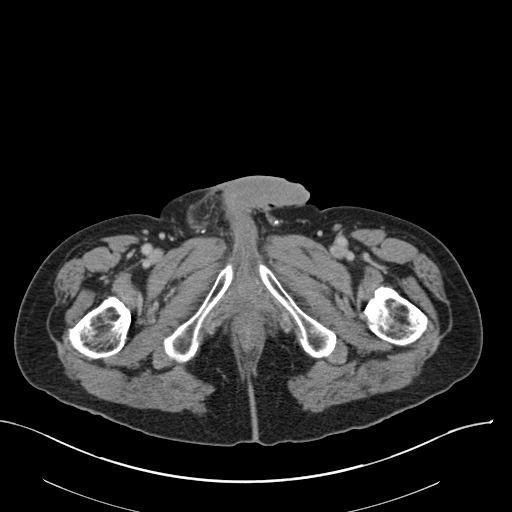
[im 18/93  soft-tissue]
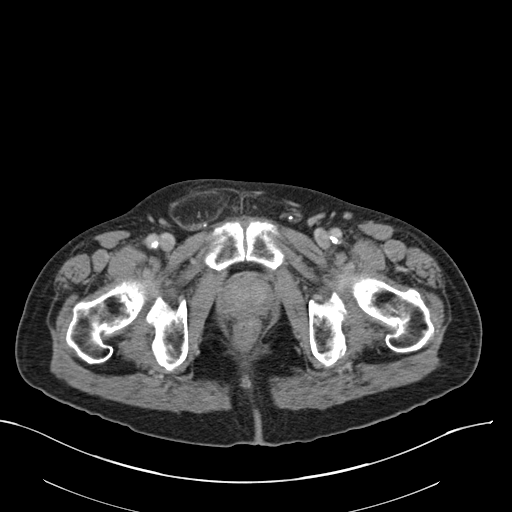
[im 29/93  soft-tissue]
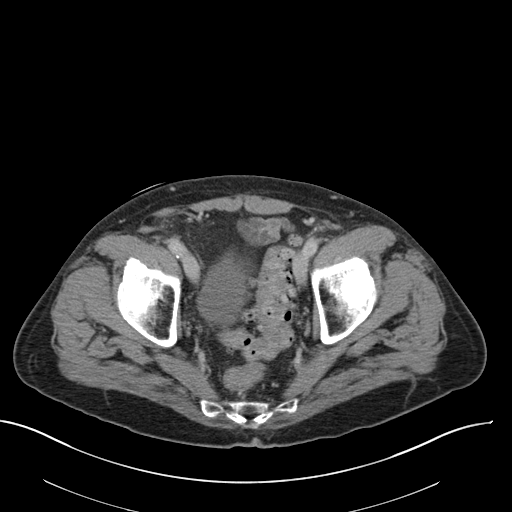
[im 35/93  soft-tissue]
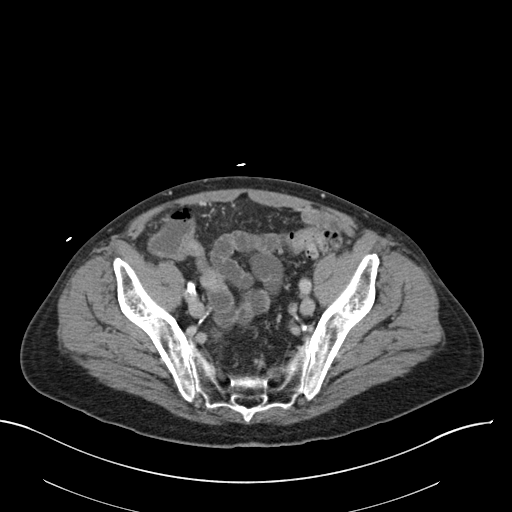
[im 41/93  soft-tissue]
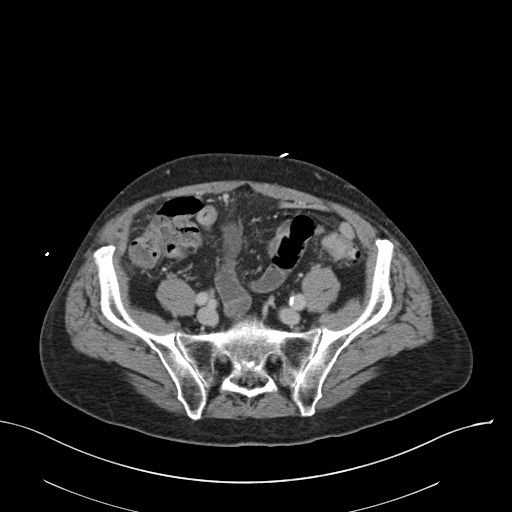
[im 47/93  soft-tissue]
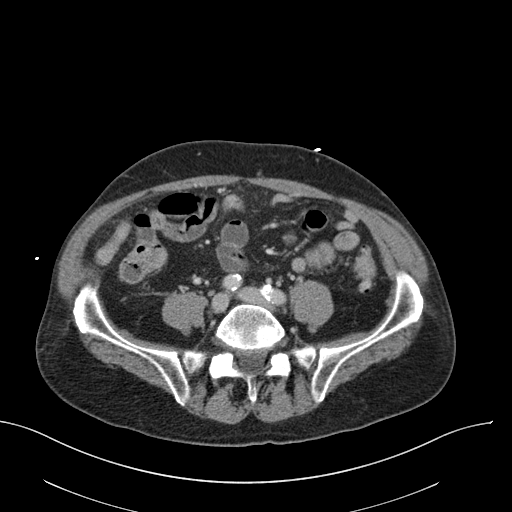
[im 52/93  soft-tissue]
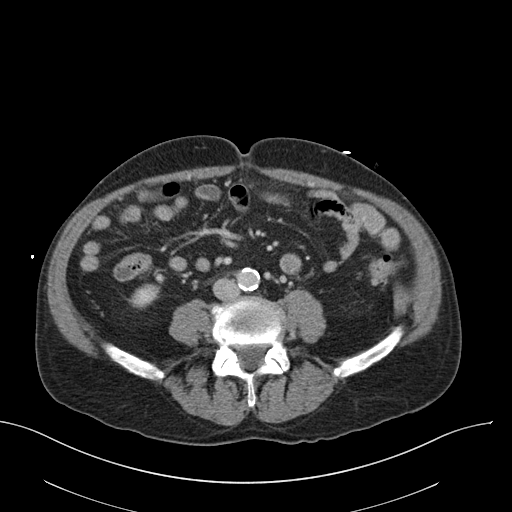
[im 58/93  soft-tissue]
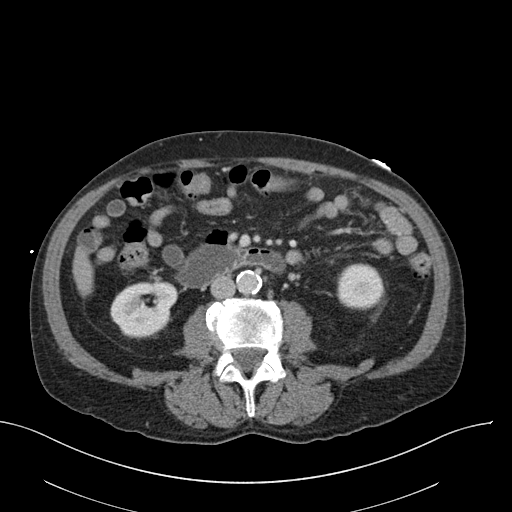
[im 58/93  bone]
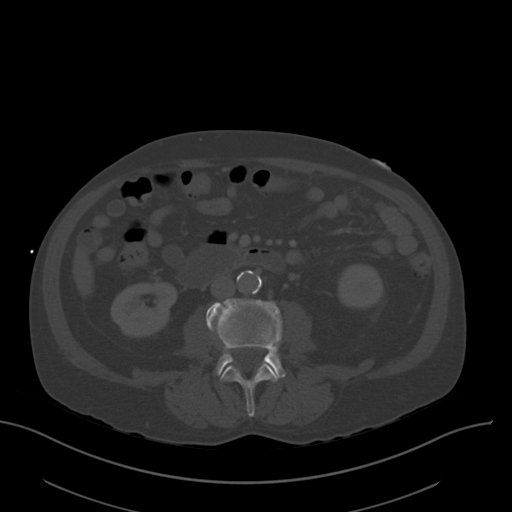
[im 64/93  soft-tissue]
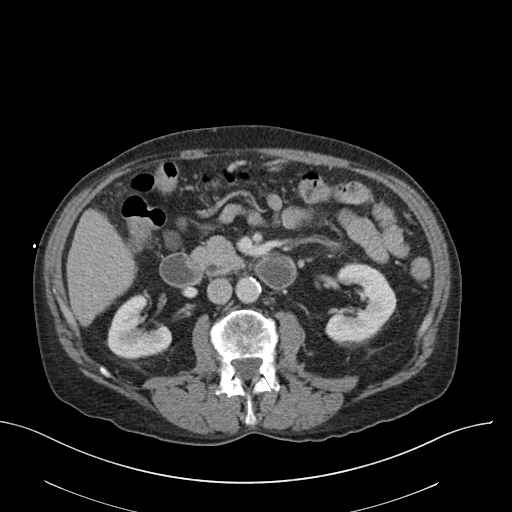
[im 75/93  soft-tissue]
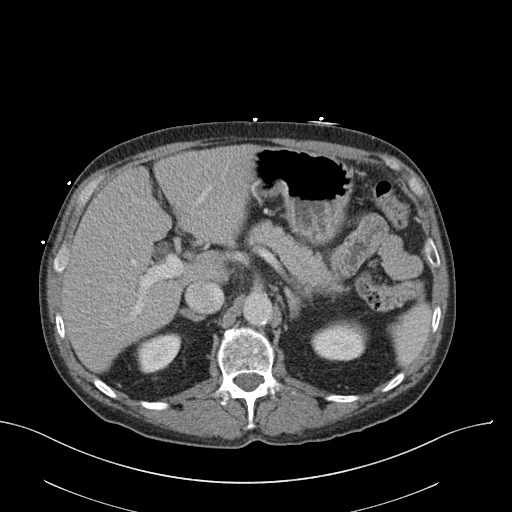
[im 81/93  soft-tissue]
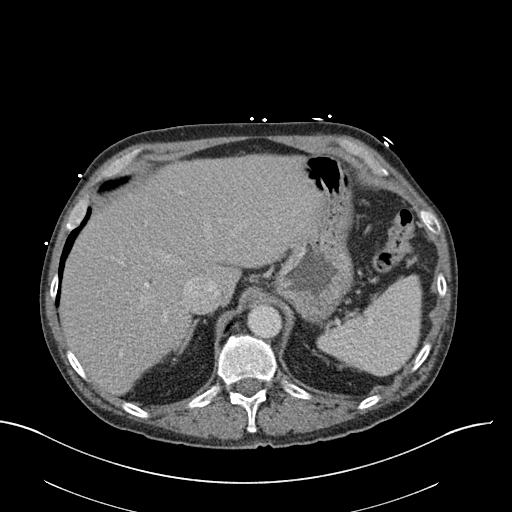
[im 87/93  soft-tissue]
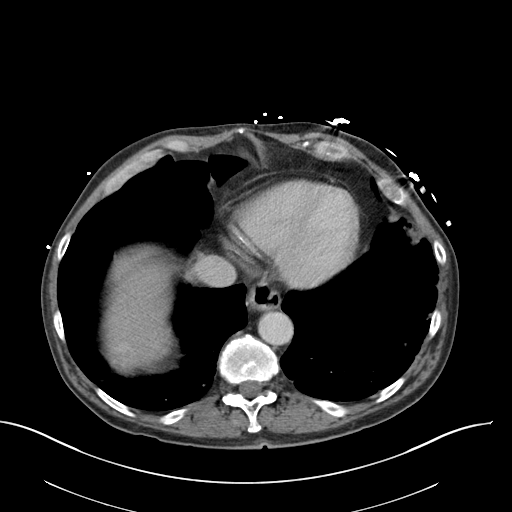

[Series 5: coronal st · coronal · 0.74mm/px · 3 of 89 slices shown]
[im 30/89  soft-tissue]
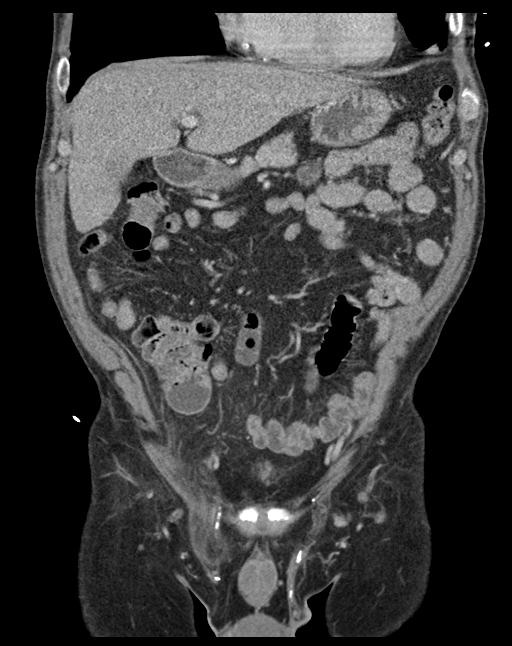
[im 40/89  soft-tissue]
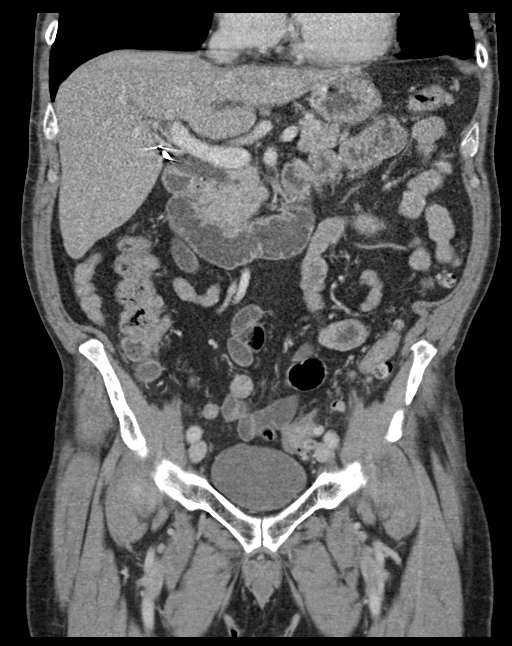
[im 49/89  soft-tissue]
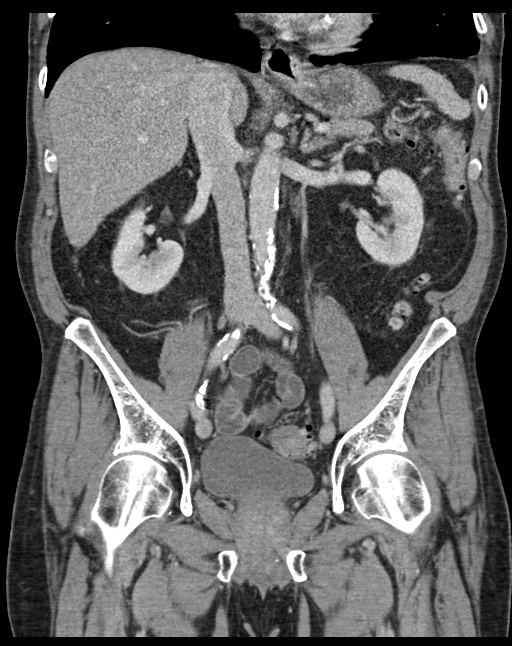

[16 of 46 positions shown; findings below may reference images not displayed]

FINDINGS: Lower chest: Dependent bibasilar atelectasis and emphysema and
scarring changes. No infiltrates or effusions. The heart is normal
in size. No pericardial effusion. Stable coronary artery
calcifications. Small hiatal hernia.

Hepatobiliary: No focal hepatic lesions or intrahepatic biliary
dilatation. The gallbladder is surgically absent. Mild stable
associated common bile duct dilatation.

Pancreas: No mass, inflammation or ductal dilatation.

Spleen: Normal size.  No focal lesions.

Adrenals/Urinary Tract: The adrenal glands and kidneys are
unremarkable and stable.

Stomach/Bowel: The stomach, duodenum, small bowel and colon are
grossly normal without oral contrast. No acute inflammatory changes,
mass lesions or obstructive findings. The terminal ileum is normal.
The appendix is surgically absent. There is descending and sigmoid
diverticulosis without findings for acute diverticulitis.

Vascular/Lymphatic: Stable advanced atherosclerotic calcifications
involving the abdominal aorta and iliac arteries but no aneurysm or
dissection. The branch vessels are patent. The major venous
structures are patent. No mesenteric or retroperitoneal mass or
adenopathy.

Reproductive: The prostate gland and seminal vesicles are
unremarkable.

Other: Stable right inguinal hernia containing fat. No inguinal mass
or adenopathy. No abdominal wall hernia or subcutaneous lesions.

Musculoskeletal: No significant bony findings.
IMPRESSION: 1. No acute abdominal/pelvic findings, mass lesions or adenopathy.
2. Descending and sigmoid colon diverticulosis but no findings for
acute diverticulitis.
3. Status post cholecystectomy with stable mild associated common
bile duct dilatation.
4. Advanced atherosclerotic calcifications.

## 2018-03-05 ENCOUNTER — Encounter (HOSPITAL_COMMUNITY): Payer: Self-pay

## 2018-03-05 ENCOUNTER — Emergency Department (HOSPITAL_COMMUNITY)
Admission: EM | Admit: 2018-03-05 | Discharge: 2018-03-05 | Disposition: A | Discharge status: Home / Self Care | Payer: 59 | Attending: Emergency Medicine | Admitting: Emergency Medicine

## 2018-03-05 ENCOUNTER — Emergency Department (HOSPITAL_COMMUNITY): Payer: 59

## 2018-03-05 ENCOUNTER — Other Ambulatory Visit: Payer: Self-pay

## 2018-03-05 DIAGNOSIS — F17210 Nicotine dependence, cigarettes, uncomplicated: Secondary | ICD-10-CM | POA: Diagnosis not present

## 2018-03-05 DIAGNOSIS — R1084 Generalized abdominal pain: Secondary | ICD-10-CM | POA: Diagnosis not present

## 2018-03-05 DIAGNOSIS — J449 Chronic obstructive pulmonary disease, unspecified: Secondary | ICD-10-CM | POA: Diagnosis not present

## 2018-03-05 DIAGNOSIS — R030 Elevated blood-pressure reading, without diagnosis of hypertension: Secondary | ICD-10-CM

## 2018-03-05 DIAGNOSIS — K4090 Unilateral inguinal hernia, without obstruction or gangrene, not specified as recurrent: Secondary | ICD-10-CM | POA: Diagnosis not present

## 2018-03-05 DIAGNOSIS — K5790 Diverticulosis of intestine, part unspecified, without perforation or abscess without bleeding: Secondary | ICD-10-CM | POA: Insufficient documentation

## 2018-03-05 DIAGNOSIS — R197 Diarrhea, unspecified: Secondary | ICD-10-CM | POA: Diagnosis not present

## 2018-03-05 DIAGNOSIS — R109 Unspecified abdominal pain: Secondary | ICD-10-CM

## 2018-03-05 DIAGNOSIS — R1110 Vomiting, unspecified: Secondary | ICD-10-CM | POA: Diagnosis not present

## 2018-03-05 LAB — COMPREHENSIVE METABOLIC PANEL
ALT: 28 U/L (ref 0–44)
AST: 30 U/L (ref 15–41)
Albumin: 4 g/dL (ref 3.5–5.0)
Alkaline Phosphatase: 84 U/L (ref 38–126)
Anion gap: 9 (ref 5–15)
BUN: 8 mg/dL (ref 8–23)
CHLORIDE: 103 mmol/L (ref 98–111)
CO2: 24 mmol/L (ref 22–32)
CREATININE: 0.73 mg/dL (ref 0.61–1.24)
Calcium: 9.4 mg/dL (ref 8.9–10.3)
GFR calc Af Amer: >60 mL/min (ref >60)
GFR calc non Af Amer: >60 mL/min (ref >60)
Glucose, Bld: 130 mg/dL — ABNORMAL HIGH (ref 70–99)
Potassium: 3.8 mmol/L (ref 3.5–5.1)
Sodium: 136 mmol/L (ref 135–145)
Total Bilirubin: 0.7 mg/dL (ref 0.3–1.2)
Total Protein: 7.7 g/dL (ref 6.5–8.1)

## 2018-03-05 LAB — URINALYSIS, ROUTINE W REFLEX MICROSCOPIC
Bilirubin Urine: NEGATIVE
GLUCOSE, UA: NEGATIVE mg/dL
Hgb urine dipstick: NEGATIVE
KETONES UR: NEGATIVE mg/dL
Leukocytes, UA: NEGATIVE
NITRITE: NEGATIVE
PROTEIN: NEGATIVE mg/dL
Specific Gravity, Urine: 1.038 — ABNORMAL HIGH (ref 1.005–1.030)
pH: 5 (ref 5.0–8.0)

## 2018-03-05 LAB — CBC
HCT: 44.7 % (ref 39.0–52.0)
Hemoglobin: 14.8 g/dL (ref 13.0–17.0)
MCH: 34.6 pg — AB (ref 26.0–34.0)
MCHC: 33.1 g/dL (ref 30.0–36.0)
MCV: 104.4 fL — AB (ref 78.0–100.0)
Platelets: 218 10*3/uL (ref 150–400)
RBC: 4.28 MIL/uL (ref 4.22–5.81)
RDW: 12.9 % (ref 11.5–15.5)
WBC: 9.6 10*3/uL (ref 4.0–10.5)

## 2018-03-05 LAB — LIPASE, BLOOD: LIPASE: 44 U/L (ref 11–51)

## 2018-03-05 MED ORDER — MORPHINE SULFATE (PF) 4 MG/ML IV SOLN
4.0000 mg | Freq: Once | INTRAVENOUS | Status: AC
  Administered 2018-03-05: 4 mg via INTRAVENOUS
  Filled 2018-03-05: qty 1

## 2018-03-05 MED ORDER — ONDANSETRON HCL 4 MG/2ML IJ SOLN
4.0000 mg | Freq: Once | INTRAMUSCULAR | Status: AC
  Administered 2018-03-05: 4 mg via INTRAVENOUS
  Filled 2018-03-05: qty 2

## 2018-03-05 MED ORDER — ONDANSETRON 4 MG PO TBDP: 4.0000 mg | ORAL_TABLET | Freq: Three times a day (TID) | ORAL | 0 refills | Status: DC | PRN

## 2018-03-05 MED ORDER — MORPHINE SULFATE (PF) 2 MG/ML IV SOLN
2.0000 mg | Freq: Once | INTRAVENOUS | Status: AC
  Administered 2018-03-05: 2 mg via INTRAVENOUS
  Filled 2018-03-05: qty 1

## 2018-03-05 MED ORDER — SODIUM CHLORIDE 0.9 % IV BOLUS
1000.0000 mL | Freq: Once | INTRAVENOUS | Status: AC
  Administered 2018-03-05: 1000 mL via INTRAVENOUS

## 2018-03-05 MED ORDER — IOHEXOL 300 MG/ML  SOLN
100.0000 mL | Freq: Once | INTRAMUSCULAR | Status: AC | PRN
  Administered 2018-03-05: 100 mL via INTRAVENOUS

## 2018-03-05 MED ORDER — OXYCODONE-ACETAMINOPHEN 5-325 MG PO TABS
1.0000 | ORAL_TABLET | ORAL | Status: DC | PRN
  Administered 2018-03-05: 1 via ORAL
  Filled 2018-03-05: qty 1

## 2018-03-05 NOTE — ED Notes (Signed)
Pt given sprite and crackers  

## 2018-03-05 NOTE — ED Triage Notes (Signed)
Pt presents for evaluation of N/V/D associated with worsened generalized abd pain. Pt reports he has episodes like this every few months for the past year. Has been evaluated multiple times with no answers.

## 2018-03-05 NOTE — ED Notes (Signed)
Patient transported to CT 

## 2018-03-05 NOTE — ED Notes (Signed)
Discharge instructions and prescriptions discussed with Pt. Pt verbalized understanding. Pt stable and ambulatory.   

## 2018-03-05 NOTE — Discharge Instructions (Addendum)
Please return to the Emergency Department for any new or worsening symptoms or if your symptoms do not improve. Please be sure to follow up with your Primary Care Physician as soon as possible regarding your visit today. If you do not have a Primary Doctor please use the resources below to establish one. Your work-up today was reassuring.  Your CT scan showed possible gastroenteritis, please be sure to drink plenty of water to avoid dehydration and follow-up with your primary care provider.  Your CT scan today showed an inguinal hernia, please be sure to speak with your primary care provider about your CT scan results. You may use the Zofran as prescribed to help with nausea and vomiting.  Please be sure to drink plenty of water to avoid dehydration.  Contact a health care provider if: Your abdominal pain changes or gets worse. You are not hungry or you lose weight without trying. You are constipated or have diarrhea for more than 2-3 days. You have pain when you urinate or have a bowel movement. Your abdominal pain wakes you up at night. Your pain gets worse with meals, after eating, or with certain foods. You are throwing up and cannot keep anything down. You have a fever. Get help right away if: Your pain does not go away as soon as your health care provider told you to expect. You cannot stop throwing up. Your pain is only in areas of the abdomen, such as the right side or the left lower portion of the abdomen. You have bloody or black stools, or stools that look like tar. You have severe pain, cramping, or bloating in your abdomen. You have signs of dehydration, such as: Dark urine, very little urine, or no urine. Cracked lips. Dry mouth. Sunken eyes. Sleepiness. Weakness. Contact a health care provider if: You have a fever. You have new symptoms. Your symptoms get worse. Get help right away if: You have pain in the groin that suddenly gets worse. A bulge in the groin gets  bigger suddenly and does not go down. You are a man and you have a sudden pain in the scrotum, or the size of your scrotum suddenly changes. A bulge in the groin area becomes red or purple and is painful to the touch. You have nausea or vomiting that does not go away. You feel your heart beating a lot more quickly than normal. You cannot have a bowel movement or pass gas.  Do not take your medicine if  develop an itchy rash, swelling in your mouth or lips, or difficulty breathing.   RESOURCE GUIDE  Chronic Pain Problems: Contact [facility]  [phone] Patients need to be referred by their primary care doctor.  Insufficient Money for Medicine: Contact [employer]:  call "211" or [place] [phone].  No Primary Care Doctor: Call [facility]  [phone] - can help you locate a primary care doctor that  accepts your insurance, provides certain services, etc. Physician Referral [phone]  Agencies that provide inexpensive medical care: [facility]  [facility]  [phone] [facility]  [phone] [facility]  [phone] [facility]  [phone] [facility]  [phone]  [place] Providers: [facility]- [address], [address]  [phone], Mon-Fri 9am-7pm, Sat 9am-1pm [place], [address] [place], [address] [place]  [phone] [facility]- [address]  [place], [address], [phone]  Only accepts [place] patients after they have their name  applied to their card  Self Pay (no insurance) in [facility]: Sickle Cell Patients: Dr Kevan Ny, [facility]  [place], [facility]- [place]  [facility] [place]- [address], [facility]- see information above (Speak to [employer] if you do not have insurance)       -  [facility]- [place], [place],  [place], [place]  Dr Vista Lawman-  [address], [place], [facility]- [address] [phone]       -  [facility]- [address], [place], also [address] [phone]       -    [facility]- [address], [place], 1st & 3rd Saturday   every month, 10am-1pm  1) Find a Doctor and Pay Out of Pocket Although you won't have to find out who is covered by your insurance plan, it is a good idea to ask around and get recommendations. You will then need to call the office and see if the doctor you have chosen will accept you as a new patient and what types of options they offer for patients who are self-pay. Some doctors offer discounts or will set up payment plans for their patients who do not have insurance, but you will need to ask so you aren't surprised when you get to your appointment.  2) Contact Your Local Health Department Not all health departments have doctors that can see patients for sick visits, but many do, so it is worth a call to see if yours does. If you don't know where your local health department is, you can check in your phone book. The CDC also has a tool to help you locate your state's health department, and many state websites also have listings of all of their local health departments.  3) Find a [place] [facility] If your illness is not likely to be very severe or complicated, you may want to try a walk in clinic. These are popping up all over the country in pharmacies, drugstores, and shopping centers. They're usually staffed by nurse practitioners or  physician assistants that have been trained to treat common illnesses and complaints. They're usually fairly quick and inexpensive. However, if you have serious medical issues or chronic medical problems, these are probably not your best option  [facility] [place], [facility], [address], [place], phone [phone] or [phone].  Monday - Friday, call for an appointment. [place], [facility], [address], [place], phone [phone] or [phone].  Monday - Friday, call for an appointment.  Abuse/Neglect: [place] [phone] [place] [phone] (After Hours)  Emergency Shelter:  [facility]  [facility] [phone]  Maternity Homes: [facility] [phone] [place] [phone]  MRSA Hotline #:   636-440-5554  [facility] Dept. [address]         [place] Phone:  [phone]                                  Phone:  [phone]                   Phone:  [phone]  [facility], [place]- [phone]       -     [facility] in [place], [address]                                  [place] [phone] or [phone] (After Hours)   [place]  Substance Abuse Resources: Alcohol and Drug Services  [phone] [place] [phone] [facility] [phone] Residential & Outpatient Substance Abuse Program   [phone]  Psychological Services: [place]  [phone] [place]  [place], [place]. [address], [place], [place]: [phone] or [phone], PicCapture.uy  Dental Assistance  If unable to pay or uninsured, contact:  [facility] or [facility]. to become qualified for the adult dental clinic.  Patients with Medicaid: [facility] [phone] [address], [place] [address], [phone]  If unable to pay, or uninsured, contact [facility] [phone]) or [place] [phone] in [place], [address] [place] in [facility]) to become qualified for the adult dental clinic   Other [place]- [place], [place], [place], [address], [place], [place], 2nd and 4th Thursday of the month at 6:30am.  10 clients each day by appointment, can sometimes see walk-in patients if someone does not show for an appointment. [facility]- [address] Danker [place], [place], [address], [place], [place], [place], [address], [place] Department- [phone] [facility] [phone]
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
## 2018-03-07 ENCOUNTER — Emergency Department (HOSPITAL_COMMUNITY)
Admission: EM | Admit: 2018-03-07 | Discharge: 2018-03-07 | Disposition: A | Discharge status: Home / Self Care | Payer: 59 | Attending: Emergency Medicine | Admitting: Emergency Medicine

## 2018-03-07 ENCOUNTER — Encounter (HOSPITAL_COMMUNITY): Payer: Self-pay | Admitting: Emergency Medicine

## 2018-03-07 DIAGNOSIS — R112 Nausea with vomiting, unspecified: Secondary | ICD-10-CM | POA: Diagnosis not present

## 2018-03-07 DIAGNOSIS — J449 Chronic obstructive pulmonary disease, unspecified: Secondary | ICD-10-CM | POA: Insufficient documentation

## 2018-03-07 DIAGNOSIS — R1030 Lower abdominal pain, unspecified: Secondary | ICD-10-CM

## 2018-03-07 DIAGNOSIS — F17210 Nicotine dependence, cigarettes, uncomplicated: Secondary | ICD-10-CM | POA: Insufficient documentation

## 2018-03-07 LAB — COMPREHENSIVE METABOLIC PANEL
ALBUMIN: 3.8 g/dL (ref 3.5–5.0)
ALK PHOS: 78 U/L (ref 38–126)
ALT: 27 U/L (ref 0–44)
ANION GAP: 11 (ref 5–15)
AST: 26 U/L (ref 15–41)
BUN: 5 mg/dL — ABNORMAL LOW (ref 8–23)
CALCIUM: 9 mg/dL (ref 8.9–10.3)
CHLORIDE: 103 mmol/L (ref 98–111)
CO2: 22 mmol/L (ref 22–32)
Creatinine, Ser: 0.69 mg/dL (ref 0.61–1.24)
GFR calc non Af Amer: >60 mL/min (ref >60)
GLUCOSE: 130 mg/dL — AB (ref 70–99)
POTASSIUM: 3.5 mmol/L (ref 3.5–5.1)
SODIUM: 136 mmol/L (ref 135–145)
Total Bilirubin: 0.7 mg/dL (ref 0.3–1.2)
Total Protein: 7.1 g/dL (ref 6.5–8.1)

## 2018-03-07 LAB — CBC WITH DIFFERENTIAL/PLATELET
ABS IMMATURE GRANULOCYTES: 0 10*3/uL (ref 0.0–0.1)
Basophils Absolute: 0.1 10*3/uL (ref 0.0–0.1)
Basophils Relative: 1 %
EOS ABS: 0.1 10*3/uL (ref 0.0–0.7)
Eosinophils Relative: 1 %
HEMATOCRIT: 42.2 % (ref 39.0–52.0)
Hemoglobin: 14.3 g/dL (ref 13.0–17.0)
IMMATURE GRANULOCYTES: 1 %
LYMPHS ABS: 1.8 10*3/uL (ref 0.7–4.0)
Lymphocytes Relative: 20 %
MCH: 34.7 pg — ABNORMAL HIGH (ref 26.0–34.0)
MCHC: 33.9 g/dL (ref 30.0–36.0)
MCV: 102.4 fL — AB (ref 78.0–100.0)
MONOS PCT: 8 %
Monocytes Absolute: 0.7 10*3/uL (ref 0.1–1.0)
NEUTROS ABS: 6.2 10*3/uL (ref 1.7–7.7)
NEUTROS PCT: 69 %
PLATELETS: 197 10*3/uL (ref 150–400)
RBC: 4.12 MIL/uL — AB (ref 4.22–5.81)
RDW: 12.3 % (ref 11.5–15.5)
WBC: 8.9 10*3/uL (ref 4.0–10.5)

## 2018-03-07 LAB — LIPASE, BLOOD: Lipase: 58 U/L — ABNORMAL HIGH (ref 11–51)

## 2018-03-07 MED ORDER — HALOPERIDOL LACTATE 5 MG/ML IJ SOLN
3.0000 mg | Freq: Once | INTRAMUSCULAR | Status: AC
  Administered 2018-03-07: 3 mg via INTRAVENOUS
  Filled 2018-03-07: qty 1

## 2018-03-07 MED ORDER — SODIUM CHLORIDE 0.9 % IV BOLUS
500.0000 mL | Freq: Once | INTRAVENOUS | Status: AC
  Administered 2018-03-07: 500 mL via INTRAVENOUS

## 2018-03-07 MED ORDER — PANTOPRAZOLE SODIUM 20 MG PO TBEC: 20.0000 mg | DELAYED_RELEASE_TABLET | Freq: Every day | ORAL | 0 refills | Status: DC

## 2018-03-07 MED ORDER — DICYCLOMINE HCL 10 MG PO CAPS
10.0000 mg | ORAL_CAPSULE | Freq: Once | ORAL | Status: AC
  Administered 2018-03-07: 10 mg via ORAL
  Filled 2018-03-07: qty 1

## 2018-03-07 MED ORDER — DICYCLOMINE HCL 20 MG PO TABS: 20.0000 mg | ORAL_TABLET | Freq: Two times a day (BID) | ORAL | 0 refills | Status: DC | PRN

## 2018-03-07 MED ORDER — METOCLOPRAMIDE HCL 10 MG PO TABS: 10.0000 mg | ORAL_TABLET | Freq: Four times a day (QID) | ORAL | 0 refills | Status: DC | PRN

## 2018-03-07 MED ORDER — METOCLOPRAMIDE HCL 5 MG/ML IJ SOLN
10.0000 mg | Freq: Once | INTRAMUSCULAR | Status: AC
  Administered 2018-03-07: 10 mg via INTRAVENOUS
  Filled 2018-03-07: qty 2

## 2018-03-07 MED ORDER — FAMOTIDINE IN NACL 20-0.9 MG/50ML-% IV SOLN
20.0000 mg | Freq: Once | INTRAVENOUS | Status: AC
  Administered 2018-03-07: 20 mg via INTRAVENOUS
  Filled 2018-03-07: qty 50

## 2018-03-07 NOTE — ED Notes (Signed)
Pt verbalized understanding of d/c instructions and has no further questions, VSS, NAD. Pt d/c home with wife driving. Pt to follow up with GI on Monday.

## 2018-03-07 NOTE — ED Triage Notes (Signed)
Pt presents for lower abd pain and vomiting. States he was seen here yesterday for the same and discharged home, unsure of what his course of care was while here.
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
## 2018-03-07 NOTE — ED Notes (Signed)
Pt unscrewed fluid bolus from IV, removed pulse ox and blood pressure cuff and ambulated to the restroom independently without difficulty.

## 2018-03-07 NOTE — Discharge Instructions (Signed)
You have been seen in the Emergency Department (ED) for abdominal pain.  Your evaluation did not identify a clear cause of your symptoms but was generally reassuring.  Call your gastroenterologist on Monday.   Please follow up as instructed above regarding todays emergent visit and the symptoms that are bothering you.  Return to the ED if your abdominal pain worsens or fails to improve, you develop bloody vomiting, bloody diarrhea, you are unable to tolerate fluids due to vomiting, fever greater than 101, or other symptoms that concern you.

## 2018-04-07 DIAGNOSIS — Z6829 Body mass index (BMI) 29.0-29.9, adult: Secondary | ICD-10-CM | POA: Diagnosis not present

## 2018-04-07 DIAGNOSIS — R1084 Generalized abdominal pain: Secondary | ICD-10-CM | POA: Diagnosis not present

## 2018-04-07 DIAGNOSIS — M79672 Pain in left foot: Secondary | ICD-10-CM | POA: Diagnosis not present

## 2018-04-07 DIAGNOSIS — Z720 Tobacco use: Secondary | ICD-10-CM | POA: Diagnosis not present

## 2018-04-07 DIAGNOSIS — K4090 Unilateral inguinal hernia, without obstruction or gangrene, not specified as recurrent: Secondary | ICD-10-CM | POA: Diagnosis not present

## 2018-04-07 DIAGNOSIS — J449 Chronic obstructive pulmonary disease, unspecified: Secondary | ICD-10-CM | POA: Diagnosis not present

## 2018-04-07 DIAGNOSIS — M79671 Pain in right foot: Secondary | ICD-10-CM | POA: Diagnosis not present

## 2018-04-07 DIAGNOSIS — B1920 Unspecified viral hepatitis C without hepatic coma: Secondary | ICD-10-CM | POA: Diagnosis not present

## 2018-04-07 DIAGNOSIS — R7302 Impaired glucose tolerance (oral): Secondary | ICD-10-CM | POA: Diagnosis not present

## 2018-04-07 DIAGNOSIS — I10 Essential (primary) hypertension: Secondary | ICD-10-CM | POA: Diagnosis not present

## 2018-06-25 ENCOUNTER — Emergency Department (HOSPITAL_COMMUNITY)
Admission: EM | Admit: 2018-06-25 | Discharge: 2018-06-25 | Disposition: A | Discharge status: Home / Self Care | Payer: Medicare Other | Attending: Emergency Medicine | Admitting: Emergency Medicine

## 2018-06-25 ENCOUNTER — Other Ambulatory Visit: Payer: Self-pay

## 2018-06-25 ENCOUNTER — Emergency Department (HOSPITAL_COMMUNITY): Payer: Medicare Other

## 2018-06-25 ENCOUNTER — Encounter (HOSPITAL_COMMUNITY): Payer: Self-pay | Admitting: Emergency Medicine

## 2018-06-25 DIAGNOSIS — R55 Syncope and collapse: Secondary | ICD-10-CM | POA: Diagnosis not present

## 2018-06-25 DIAGNOSIS — F17210 Nicotine dependence, cigarettes, uncomplicated: Secondary | ICD-10-CM | POA: Insufficient documentation

## 2018-06-25 DIAGNOSIS — Z79899 Other long term (current) drug therapy: Secondary | ICD-10-CM | POA: Insufficient documentation

## 2018-06-25 DIAGNOSIS — J449 Chronic obstructive pulmonary disease, unspecified: Secondary | ICD-10-CM | POA: Diagnosis not present

## 2018-06-25 DIAGNOSIS — R1084 Generalized abdominal pain: Secondary | ICD-10-CM | POA: Diagnosis not present

## 2018-06-25 DIAGNOSIS — R1030 Lower abdominal pain, unspecified: Secondary | ICD-10-CM | POA: Diagnosis not present

## 2018-06-25 DIAGNOSIS — K760 Fatty (change of) liver, not elsewhere classified: Secondary | ICD-10-CM | POA: Diagnosis not present

## 2018-06-25 DIAGNOSIS — R1032 Left lower quadrant pain: Secondary | ICD-10-CM | POA: Insufficient documentation

## 2018-06-25 DIAGNOSIS — K5730 Diverticulosis of large intestine without perforation or abscess without bleeding: Secondary | ICD-10-CM | POA: Diagnosis not present

## 2018-06-25 DIAGNOSIS — I10 Essential (primary) hypertension: Secondary | ICD-10-CM | POA: Diagnosis not present

## 2018-06-25 LAB — CBC
HEMATOCRIT: 40.9 % (ref 39.0–52.0)
Hemoglobin: 14 g/dL (ref 13.0–17.0)
MCH: 34.4 pg — ABNORMAL HIGH (ref 26.0–34.0)
MCHC: 34.2 g/dL (ref 30.0–36.0)
MCV: 100.5 fL — ABNORMAL HIGH (ref 80.0–100.0)
Platelets: 246 10*3/uL (ref 150–400)
RBC: 4.07 MIL/uL — ABNORMAL LOW (ref 4.22–5.81)
RDW: 11.9 % (ref 11.5–15.5)
WBC: 8.4 10*3/uL (ref 4.0–10.5)
nRBC: 0 % (ref 0.0–0.2)

## 2018-06-25 LAB — BASIC METABOLIC PANEL
Anion gap: 13 (ref 5–15)
BUN: 8 mg/dL (ref 8–23)
CO2: 23 mmol/L (ref 22–32)
CREATININE: 0.9 mg/dL (ref 0.61–1.24)
Calcium: 9.4 mg/dL (ref 8.9–10.3)
Chloride: 98 mmol/L (ref 98–111)
GFR calc Af Amer: >60 mL/min (ref >60)
Glucose, Bld: 137 mg/dL — ABNORMAL HIGH (ref 70–99)
POTASSIUM: 3.3 mmol/L — AB (ref 3.5–5.1)
SODIUM: 134 mmol/L — AB (ref 135–145)

## 2018-06-25 LAB — URINALYSIS, ROUTINE W REFLEX MICROSCOPIC
Bilirubin Urine: NEGATIVE
GLUCOSE, UA: NEGATIVE mg/dL
Hgb urine dipstick: NEGATIVE
KETONES UR: 20 mg/dL — AB
Leukocytes, UA: NEGATIVE
Nitrite: NEGATIVE
PH: 5 (ref 5.0–8.0)
Protein, ur: NEGATIVE mg/dL
Specific Gravity, Urine: >1.046 — ABNORMAL HIGH (ref 1.005–1.030)

## 2018-06-25 MED ORDER — HYDROMORPHONE HCL 1 MG/ML IJ SOLN
1.0000 mg | Freq: Once | INTRAMUSCULAR | Status: AC
  Administered 2018-06-25: 1 mg via INTRAVENOUS
  Filled 2018-06-25: qty 1

## 2018-06-25 MED ORDER — HYDROMORPHONE HCL 1 MG/ML IJ SOLN
0.5000 mg | Freq: Once | INTRAMUSCULAR | Status: AC
  Administered 2018-06-25: 0.5 mg via INTRAVENOUS
  Filled 2018-06-25: qty 1

## 2018-06-25 MED ORDER — HYDROCODONE-ACETAMINOPHEN 5-325 MG PO TABS: 1.0000 | ORAL_TABLET | Freq: Four times a day (QID) | ORAL | 0 refills | Status: DC | PRN

## 2018-06-25 MED ORDER — ONDANSETRON 4 MG PO TBDP
4.0000 mg | ORAL_TABLET | Freq: Once | ORAL | Status: DC
  Filled 2018-06-25: qty 1

## 2018-06-25 MED ORDER — OXYCODONE-ACETAMINOPHEN 5-325 MG PO TABS
1.0000 | ORAL_TABLET | ORAL | Status: DC | PRN
  Administered 2018-06-25: 1 via ORAL
  Filled 2018-06-25: qty 1

## 2018-06-25 MED ORDER — SODIUM CHLORIDE 0.9% FLUSH
3.0000 mL | Freq: Once | INTRAVENOUS | Status: AC
  Administered 2018-06-25: 3 mL via INTRAVENOUS

## 2018-06-25 MED ORDER — ONDANSETRON HCL 4 MG/2ML IJ SOLN
4.0000 mg | Freq: Once | INTRAMUSCULAR | Status: AC
  Administered 2018-06-25: 4 mg via INTRAVENOUS
  Filled 2018-06-25: qty 2

## 2018-06-25 MED ORDER — SODIUM CHLORIDE 0.9 % IV BOLUS
1000.0000 mL | Freq: Once | INTRAVENOUS | Status: AC
  Administered 2018-06-25: 1000 mL via INTRAVENOUS

## 2018-06-25 MED ORDER — IOHEXOL 300 MG/ML  SOLN
100.0000 mL | Freq: Once | INTRAMUSCULAR | Status: AC | PRN
  Administered 2018-06-25: 100 mL via INTRAVENOUS

## 2018-06-25 MED ORDER — CIPROFLOXACIN HCL 500 MG PO TABS: 500.0000 mg | ORAL_TABLET | Freq: Two times a day (BID) | ORAL | 0 refills | Status: DC

## 2018-06-25 NOTE — ED Triage Notes (Signed)
Patient presents to the ED by EMS with c/o abdominal pain and syncopal episode. Reports this may be his diverticulitis. Also with burning pain rad down bilateral legs. A/O at triage.

## 2018-06-25 NOTE — Discharge Instructions (Addendum)
Follow-up with your doctor next week for recheck 
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
## 2018-08-11 DIAGNOSIS — Z6829 Body mass index (BMI) 29.0-29.9, adult: Secondary | ICD-10-CM | POA: Diagnosis not present

## 2018-08-11 DIAGNOSIS — M79672 Pain in left foot: Secondary | ICD-10-CM | POA: Diagnosis not present

## 2018-08-11 DIAGNOSIS — Z1331 Encounter for screening for depression: Secondary | ICD-10-CM | POA: Diagnosis not present

## 2018-08-11 DIAGNOSIS — B1920 Unspecified viral hepatitis C without hepatic coma: Secondary | ICD-10-CM | POA: Diagnosis not present

## 2018-08-11 DIAGNOSIS — M545 Low back pain: Secondary | ICD-10-CM | POA: Diagnosis not present

## 2018-08-11 DIAGNOSIS — I10 Essential (primary) hypertension: Secondary | ICD-10-CM | POA: Diagnosis not present

## 2018-08-11 DIAGNOSIS — R7302 Impaired glucose tolerance (oral): Secondary | ICD-10-CM | POA: Diagnosis not present

## 2018-08-11 DIAGNOSIS — M79671 Pain in right foot: Secondary | ICD-10-CM | POA: Diagnosis not present

## 2018-08-11 DIAGNOSIS — Z720 Tobacco use: Secondary | ICD-10-CM | POA: Diagnosis not present

## 2018-08-11 DIAGNOSIS — J449 Chronic obstructive pulmonary disease, unspecified: Secondary | ICD-10-CM | POA: Diagnosis not present

## 2018-08-30 ENCOUNTER — Encounter (HOSPITAL_COMMUNITY): Payer: Self-pay | Admitting: Oncology

## 2018-08-30 ENCOUNTER — Other Ambulatory Visit: Payer: Self-pay

## 2018-08-30 ENCOUNTER — Emergency Department (HOSPITAL_COMMUNITY)
Admission: EM | Admit: 2018-08-30 | Discharge: 2018-08-30 | Disposition: A | Discharge status: Home / Self Care | Payer: Medicare Other | Attending: Emergency Medicine | Admitting: Emergency Medicine

## 2018-08-30 ENCOUNTER — Emergency Department (HOSPITAL_COMMUNITY): Payer: Medicare Other

## 2018-08-30 DIAGNOSIS — Z765 Malingerer [conscious simulation]: Secondary | ICD-10-CM

## 2018-08-30 DIAGNOSIS — Z7289 Other problems related to lifestyle: Secondary | ICD-10-CM | POA: Diagnosis not present

## 2018-08-30 DIAGNOSIS — R1110 Vomiting, unspecified: Secondary | ICD-10-CM | POA: Diagnosis not present

## 2018-08-30 DIAGNOSIS — F17210 Nicotine dependence, cigarettes, uncomplicated: Secondary | ICD-10-CM | POA: Diagnosis not present

## 2018-08-30 DIAGNOSIS — J449 Chronic obstructive pulmonary disease, unspecified: Secondary | ICD-10-CM | POA: Insufficient documentation

## 2018-08-30 DIAGNOSIS — Z79899 Other long term (current) drug therapy: Secondary | ICD-10-CM | POA: Diagnosis not present

## 2018-08-30 DIAGNOSIS — R1084 Generalized abdominal pain: Secondary | ICD-10-CM | POA: Insufficient documentation

## 2018-08-30 DIAGNOSIS — R109 Unspecified abdominal pain: Secondary | ICD-10-CM | POA: Diagnosis not present

## 2018-08-30 LAB — RAPID URINE DRUG SCREEN, HOSP PERFORMED
AMPHETAMINES: NOT DETECTED
Barbiturates: NOT DETECTED
Benzodiazepines: NOT DETECTED
Cocaine: NOT DETECTED
Opiates: POSITIVE — AB
Tetrahydrocannabinol: NOT DETECTED

## 2018-08-30 LAB — CBC WITH DIFFERENTIAL/PLATELET
Abs Immature Granulocytes: 0.02 10*3/uL (ref 0.00–0.07)
Basophils Absolute: 0.1 10*3/uL (ref 0.0–0.1)
Basophils Relative: 1 %
EOS PCT: 1 %
Eosinophils Absolute: 0.1 10*3/uL (ref 0.0–0.5)
HCT: 39.7 % (ref 39.0–52.0)
Hemoglobin: 13.4 g/dL (ref 13.0–17.0)
Immature Granulocytes: 0 %
Lymphocytes Relative: 23 %
Lymphs Abs: 1.6 10*3/uL (ref 0.7–4.0)
MCH: 34.9 pg — AB (ref 26.0–34.0)
MCHC: 33.8 g/dL (ref 30.0–36.0)
MCV: 103.4 fL — ABNORMAL HIGH (ref 80.0–100.0)
Monocytes Absolute: 0.5 10*3/uL (ref 0.1–1.0)
Monocytes Relative: 7 %
Neutro Abs: 4.6 10*3/uL (ref 1.7–7.7)
Neutrophils Relative %: 68 %
Platelets: 218 10*3/uL (ref 150–400)
RBC: 3.84 MIL/uL — ABNORMAL LOW (ref 4.22–5.81)
RDW: 12.6 % (ref 11.5–15.5)
WBC: 6.9 10*3/uL (ref 4.0–10.5)
nRBC: 0 % (ref 0.0–0.2)

## 2018-08-30 LAB — LIPASE, BLOOD: Lipase: 38 U/L (ref 11–51)

## 2018-08-30 LAB — COMPREHENSIVE METABOLIC PANEL
ALT: 21 U/L (ref 0–44)
AST: 28 U/L (ref 15–41)
Albumin: 3.8 g/dL (ref 3.5–5.0)
Alkaline Phosphatase: 73 U/L (ref 38–126)
Anion gap: 12 (ref 5–15)
BUN: 10 mg/dL (ref 8–23)
CO2: 21 mmol/L — ABNORMAL LOW (ref 22–32)
CREATININE: 0.74 mg/dL (ref 0.61–1.24)
Calcium: 8.8 mg/dL — ABNORMAL LOW (ref 8.9–10.3)
Chloride: 102 mmol/L (ref 98–111)
GFR calc Af Amer: >60 mL/min (ref >60)
GFR calc non Af Amer: >60 mL/min (ref >60)
Glucose, Bld: 129 mg/dL — ABNORMAL HIGH (ref 70–99)
Potassium: 3.9 mmol/L (ref 3.5–5.1)
Sodium: 135 mmol/L (ref 135–145)
Total Bilirubin: 0.9 mg/dL (ref 0.3–1.2)
Total Protein: 7.4 g/dL (ref 6.5–8.1)

## 2018-08-30 LAB — URINALYSIS, ROUTINE W REFLEX MICROSCOPIC
Bilirubin Urine: NEGATIVE
GLUCOSE, UA: NEGATIVE mg/dL
Hgb urine dipstick: NEGATIVE
Ketones, ur: 5 mg/dL — AB
Leukocytes,Ua: NEGATIVE
Nitrite: NEGATIVE
PROTEIN: NEGATIVE mg/dL
Specific Gravity, Urine: 1.035 — ABNORMAL HIGH (ref 1.005–1.030)
pH: 6 (ref 5.0–8.0)

## 2018-08-30 MED ORDER — ONDANSETRON 4 MG PO TBDP: 4.0000 mg | ORAL_TABLET | Freq: Three times a day (TID) | ORAL | 0 refills | Status: DC | PRN

## 2018-08-30 MED ORDER — SODIUM CHLORIDE 0.9 % IV BOLUS (SEPSIS)
1000.0000 mL | Freq: Once | INTRAVENOUS | Status: AC
  Administered 2018-08-30: 1000 mL via INTRAVENOUS

## 2018-08-30 MED ORDER — PROBIOTIC PO CAPS: 1.0000 | ORAL_CAPSULE | Freq: Every day | ORAL | 1 refills | Status: AC

## 2018-08-30 MED ORDER — DICYCLOMINE HCL 20 MG PO TABS: 20.0000 mg | ORAL_TABLET | Freq: Three times a day (TID) | ORAL | 0 refills | Status: AC | PRN

## 2018-08-30 MED ORDER — CIPROFLOXACIN HCL 500 MG PO TABS: 500.0000 mg | ORAL_TABLET | Freq: Two times a day (BID) | ORAL | 0 refills | Status: AC

## 2018-08-30 MED ORDER — IOHEXOL 300 MG/ML  SOLN
100.0000 mL | Freq: Once | INTRAMUSCULAR | Status: AC | PRN
  Administered 2018-08-30: 100 mL via INTRAVENOUS

## 2018-08-30 MED ORDER — ONDANSETRON HCL 4 MG/2ML IJ SOLN
4.0000 mg | Freq: Once | INTRAMUSCULAR | Status: AC
  Administered 2018-08-30: 4 mg via INTRAVENOUS
  Filled 2018-08-30: qty 2

## 2018-08-30 MED ORDER — METRONIDAZOLE 500 MG PO TABS: 500.0000 mg | ORAL_TABLET | Freq: Two times a day (BID) | ORAL | 0 refills | Status: AC

## 2018-08-30 MED ORDER — MORPHINE SULFATE (PF) 4 MG/ML IV SOLN
4.0000 mg | Freq: Once | INTRAVENOUS | Status: AC
  Administered 2018-08-30: 4 mg via INTRAVENOUS
  Filled 2018-08-30: qty 1

## 2018-08-30 MED ORDER — HYDROMORPHONE HCL 1 MG/ML IJ SOLN
1.0000 mg | Freq: Once | INTRAMUSCULAR | Status: AC
  Administered 2018-08-30: 1 mg via INTRAVENOUS
  Filled 2018-08-30: qty 1

## 2018-08-30 MED ORDER — DICYCLOMINE HCL 10 MG/ML IM SOLN
20.0000 mg | Freq: Once | INTRAMUSCULAR | Status: AC
  Administered 2018-08-30: 20 mg via INTRAMUSCULAR
  Filled 2018-08-30: qty 2

## 2018-08-30 NOTE — Discharge Instructions (Signed)
Your labs, urine, CT of your abdomen pelvis were normal today.  I recommend you follow-up with your primary care doctor symptoms continue.  We are discharging you with prescriptions of Cipro and Flagyl to start in the next 2 to 3 days if symptoms are not improving.  This could be early diverticulitis that is not yet showing up on CT imaging.  At this time, I cannot discharge you with prescriptions of narcotic pain medicine given it appears he received a regular prescription of oxycodone monthly.  We have sent prescriptions of antibiotics, probiotics, nausea medicine and nonnarcotic pain medicine called Bentyl to your pharmacy.

## 2018-08-30 NOTE — ED Notes (Signed)
Patient transported to CT 
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
## 2018-08-30 NOTE — ED Triage Notes (Signed)
Pt reports lower abdomen pain that started Saturday morning.  Pt reports lack of appetite.  States nausea and dry heaving began approximately one hour ago.  Has been told he has diverticulosis.

## 2018-09-01 ENCOUNTER — Emergency Department (HOSPITAL_COMMUNITY): Payer: Medicare Other

## 2018-09-01 ENCOUNTER — Encounter (HOSPITAL_COMMUNITY): Payer: Self-pay | Admitting: Emergency Medicine

## 2018-09-01 ENCOUNTER — Other Ambulatory Visit: Payer: Self-pay

## 2018-09-01 ENCOUNTER — Emergency Department (HOSPITAL_COMMUNITY)
Admission: EM | Admit: 2018-09-01 | Discharge: 2018-09-01 | Disposition: A | Discharge status: Home / Self Care | Payer: Medicare Other | Attending: Emergency Medicine | Admitting: Emergency Medicine

## 2018-09-01 DIAGNOSIS — Z79899 Other long term (current) drug therapy: Secondary | ICD-10-CM | POA: Diagnosis not present

## 2018-09-01 DIAGNOSIS — F17210 Nicotine dependence, cigarettes, uncomplicated: Secondary | ICD-10-CM | POA: Insufficient documentation

## 2018-09-01 DIAGNOSIS — M5489 Other dorsalgia: Secondary | ICD-10-CM | POA: Diagnosis not present

## 2018-09-01 DIAGNOSIS — R1084 Generalized abdominal pain: Secondary | ICD-10-CM

## 2018-09-01 DIAGNOSIS — R112 Nausea with vomiting, unspecified: Secondary | ICD-10-CM | POA: Diagnosis present

## 2018-09-01 DIAGNOSIS — R0602 Shortness of breath: Secondary | ICD-10-CM | POA: Diagnosis not present

## 2018-09-01 DIAGNOSIS — R509 Fever, unspecified: Secondary | ICD-10-CM | POA: Diagnosis not present

## 2018-09-01 DIAGNOSIS — I10 Essential (primary) hypertension: Secondary | ICD-10-CM | POA: Diagnosis not present

## 2018-09-01 DIAGNOSIS — J449 Chronic obstructive pulmonary disease, unspecified: Secondary | ICD-10-CM | POA: Insufficient documentation

## 2018-09-01 DIAGNOSIS — R05 Cough: Secondary | ICD-10-CM | POA: Diagnosis not present

## 2018-09-01 LAB — COMPREHENSIVE METABOLIC PANEL
ALT: 24 U/L (ref 0–44)
AST: 44 U/L — AB (ref 15–41)
Albumin: 4.1 g/dL (ref 3.5–5.0)
Alkaline Phosphatase: 67 U/L (ref 38–126)
Anion gap: 11 (ref 5–15)
BUN: 9 mg/dL (ref 8–23)
CO2: 20 mmol/L — ABNORMAL LOW (ref 22–32)
Calcium: 9.1 mg/dL (ref 8.9–10.3)
Chloride: 104 mmol/L (ref 98–111)
Creatinine, Ser: 0.83 mg/dL (ref 0.61–1.24)
GFR calc Af Amer: >60 mL/min (ref >60)
Glucose, Bld: 133 mg/dL — ABNORMAL HIGH (ref 70–99)
Potassium: 4.4 mmol/L (ref 3.5–5.1)
Sodium: 135 mmol/L (ref 135–145)
Total Bilirubin: 1.5 mg/dL — ABNORMAL HIGH (ref 0.3–1.2)
Total Protein: 7.6 g/dL (ref 6.5–8.1)

## 2018-09-01 LAB — CBC WITH DIFFERENTIAL/PLATELET
ABS IMMATURE GRANULOCYTES: 0.04 10*3/uL (ref 0.00–0.07)
Basophils Absolute: 0 10*3/uL (ref 0.0–0.1)
Basophils Relative: 0 %
Eosinophils Absolute: 0 10*3/uL (ref 0.0–0.5)
Eosinophils Relative: 0 %
HCT: 42.1 % (ref 39.0–52.0)
HEMOGLOBIN: 14.5 g/dL (ref 13.0–17.0)
Immature Granulocytes: 0 %
Lymphocytes Relative: 9 %
Lymphs Abs: 0.8 10*3/uL (ref 0.7–4.0)
MCH: 34.8 pg — ABNORMAL HIGH (ref 26.0–34.0)
MCHC: 34.4 g/dL (ref 30.0–36.0)
MCV: 101 fL — ABNORMAL HIGH (ref 80.0–100.0)
Monocytes Absolute: 0.3 10*3/uL (ref 0.1–1.0)
Monocytes Relative: 3 %
Neutro Abs: 7.7 10*3/uL (ref 1.7–7.7)
Neutrophils Relative %: 88 %
Platelets: 242 10*3/uL (ref 150–400)
RBC: 4.17 MIL/uL — ABNORMAL LOW (ref 4.22–5.81)
RDW: 12.1 % (ref 11.5–15.5)
WBC: 8.9 10*3/uL (ref 4.0–10.5)
nRBC: 0 % (ref 0.0–0.2)

## 2018-09-01 LAB — LACTIC ACID, PLASMA: Lactic Acid, Venous: 2.4 mmol/L (ref 0.5–1.9)

## 2018-09-01 MED ORDER — HYDROMORPHONE HCL 1 MG/ML IJ SOLN
1.0000 mg | Freq: Once | INTRAMUSCULAR | Status: AC
  Administered 2018-09-01: 1 mg via INTRAVENOUS
  Filled 2018-09-01: qty 1

## 2018-09-01 MED ORDER — DICYCLOMINE HCL 10 MG/ML IM SOLN
20.0000 mg | Freq: Once | INTRAMUSCULAR | Status: AC
  Administered 2018-09-01: 20 mg via INTRAMUSCULAR
  Filled 2018-09-01: qty 2

## 2018-09-01 MED ORDER — HYDROCODONE-ACETAMINOPHEN 5-325 MG PO TABS: 1.0000 | ORAL_TABLET | Freq: Four times a day (QID) | ORAL | 0 refills | Status: AC | PRN

## 2018-09-01 MED ORDER — LORAZEPAM 2 MG/ML IJ SOLN
1.0000 mg | Freq: Once | INTRAMUSCULAR | Status: AC
  Administered 2018-09-01: 1 mg via INTRAVENOUS
  Filled 2018-09-01: qty 1

## 2018-09-01 NOTE — Discharge Instructions (Signed)
As discussed, today's evaluation has been generally reassuring, similar to that from a few days ago. Is important that you obtain all of your recently prescribed medication and to today's as well. Take your medication as prescribed and be sure to follow-up with either your gastroenterologist or primary care physician within the next 3 days. Return here for concerning changes in your condition.

## 2018-09-01 NOTE — ED Notes (Signed)
Patient verbalizes understanding of discharge instructions. Opportunity for questioning and answers were provided. Armband removed by staff, pt discharged from ED ambulatory to home.  

## 2018-09-01 NOTE — ED Notes (Signed)
Pt states he did not get his meds filled because he did not have $ and then said he had some of the meds at home,, unsure what meds he does have cannot name them

## 2018-09-01 NOTE — ED Notes (Signed)
Pt writhing and jerking on stretcher, dry heaving

## 2018-09-01 NOTE — ED Triage Notes (Signed)
[facility]- pt here with complaints of abdominal and back pain and fever. Pt states he also has been coughing for two days. Pt has been vomiting. Pt here on Saturday for same conditions. Pt has history of diverticulosis and COPD. EMS gave 4 mg of zofran.
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
## 2018-10-08 ENCOUNTER — Other Ambulatory Visit: Payer: Self-pay

## 2018-10-08 ENCOUNTER — Encounter (HOSPITAL_COMMUNITY): Payer: Self-pay | Admitting: Emergency Medicine

## 2018-10-08 ENCOUNTER — Emergency Department (HOSPITAL_COMMUNITY): Payer: Medicare Other

## 2018-10-08 ENCOUNTER — Emergency Department (HOSPITAL_COMMUNITY)
Admission: EM | Admit: 2018-10-08 | Discharge: 2018-10-08 | Disposition: A | Discharge status: Home / Self Care | Payer: Medicare Other | Attending: Emergency Medicine | Admitting: Emergency Medicine

## 2018-10-08 DIAGNOSIS — R102 Pelvic and perineal pain: Secondary | ICD-10-CM | POA: Diagnosis not present

## 2018-10-08 DIAGNOSIS — F17210 Nicotine dependence, cigarettes, uncomplicated: Secondary | ICD-10-CM | POA: Insufficient documentation

## 2018-10-08 DIAGNOSIS — R110 Nausea: Secondary | ICD-10-CM | POA: Insufficient documentation

## 2018-10-08 DIAGNOSIS — Z79899 Other long term (current) drug therapy: Secondary | ICD-10-CM | POA: Diagnosis not present

## 2018-10-08 DIAGNOSIS — R1084 Generalized abdominal pain: Secondary | ICD-10-CM | POA: Insufficient documentation

## 2018-10-08 LAB — COMPREHENSIVE METABOLIC PANEL
ALT: 24 U/L (ref 0–44)
AST: 28 U/L (ref 15–41)
Albumin: 4 g/dL (ref 3.5–5.0)
Alkaline Phosphatase: 78 U/L (ref 38–126)
Anion gap: 13 (ref 5–15)
BUN: 8 mg/dL (ref 8–23)
CO2: 21 mmol/L — ABNORMAL LOW (ref 22–32)
Calcium: 9.5 mg/dL (ref 8.9–10.3)
Chloride: 102 mmol/L (ref 98–111)
Creatinine, Ser: 0.78 mg/dL (ref 0.61–1.24)
GFR calc Af Amer: >60 mL/min (ref >60)
GFR calc non Af Amer: >60 mL/min (ref >60)
Glucose, Bld: 134 mg/dL — ABNORMAL HIGH (ref 70–99)
Potassium: 4 mmol/L (ref 3.5–5.1)
Sodium: 136 mmol/L (ref 135–145)
Total Bilirubin: 0.9 mg/dL (ref 0.3–1.2)
Total Protein: 7.7 g/dL (ref 6.5–8.1)

## 2018-10-08 LAB — CBC
HCT: 46.8 % (ref 39.0–52.0)
Hemoglobin: 15.6 g/dL (ref 13.0–17.0)
MCH: 33.9 pg (ref 26.0–34.0)
MCHC: 33.3 g/dL (ref 30.0–36.0)
MCV: 101.7 fL — ABNORMAL HIGH (ref 80.0–100.0)
Platelets: 221 10*3/uL (ref 150–400)
RBC: 4.6 MIL/uL (ref 4.22–5.81)
RDW: 12.3 % (ref 11.5–15.5)
WBC: 9.1 10*3/uL (ref 4.0–10.5)
nRBC: 0 % (ref 0.0–0.2)

## 2018-10-08 LAB — URINALYSIS, ROUTINE W REFLEX MICROSCOPIC
Bilirubin Urine: NEGATIVE
Glucose, UA: NEGATIVE mg/dL
Hgb urine dipstick: NEGATIVE
Ketones, ur: 20 mg/dL — AB
Leukocytes,Ua: NEGATIVE
Nitrite: NEGATIVE
Protein, ur: NEGATIVE mg/dL
Specific Gravity, Urine: 1.038 — ABNORMAL HIGH (ref 1.005–1.030)
pH: 7 (ref 5.0–8.0)

## 2018-10-08 LAB — LIPASE, BLOOD: Lipase: 32 U/L (ref 11–51)

## 2018-10-08 MED ORDER — PANTOPRAZOLE SODIUM 20 MG PO TBEC: 20.0000 mg | DELAYED_RELEASE_TABLET | Freq: Every day | ORAL | 0 refills | Status: AC

## 2018-10-08 MED ORDER — HYDROCODONE-ACETAMINOPHEN 5-325 MG PO TABS
1.0000 | ORAL_TABLET | Freq: Once | ORAL | Status: DC
  Filled 2018-10-08: qty 1

## 2018-10-08 MED ORDER — IOPAMIDOL (ISOVUE-300) INJECTION 61%
100.0000 mL | Freq: Once | INTRAVENOUS | Status: AC | PRN
  Administered 2018-10-08: 09:00:00 100 mL via INTRAVENOUS

## 2018-10-08 MED ORDER — FENTANYL CITRATE (PF) 100 MCG/2ML IJ SOLN
50.0000 ug | Freq: Once | INTRAMUSCULAR | Status: AC
  Administered 2018-10-08: 08:00:00 50 ug via INTRAVENOUS
  Filled 2018-10-08: qty 2

## 2018-10-08 MED ORDER — METOCLOPRAMIDE HCL 5 MG/ML IJ SOLN
10.0000 mg | Freq: Once | INTRAMUSCULAR | Status: AC
  Administered 2018-10-08: 09:00:00 10 mg via INTRAVENOUS
  Filled 2018-10-08: qty 2

## 2018-10-08 MED ORDER — ONDANSETRON 4 MG PO TBDP: 4.0000 mg | ORAL_TABLET | Freq: Three times a day (TID) | ORAL | 0 refills | Status: AC | PRN

## 2018-10-08 MED ORDER — SODIUM CHLORIDE 0.9 % IV BOLUS
500.0000 mL | Freq: Once | INTRAVENOUS | Status: AC
  Administered 2018-10-08: 09:00:00 500 mL via INTRAVENOUS

## 2018-10-08 MED ORDER — SODIUM CHLORIDE 0.9% FLUSH
3.0000 mL | Freq: Once | INTRAVENOUS | Status: AC
  Administered 2018-10-08: 06:00:00 3 mL via INTRAVENOUS

## 2018-10-08 MED ORDER — DIPHENHYDRAMINE HCL 25 MG PO CAPS
25.0000 mg | ORAL_CAPSULE | Freq: Once | ORAL | Status: DC
  Filled 2018-10-08: qty 1

## 2018-10-08 MED ORDER — SODIUM CHLORIDE 0.9 % IV BOLUS
500.0000 mL | Freq: Once | INTRAVENOUS | Status: AC
  Administered 2018-10-08: 08:00:00 500 mL via INTRAVENOUS

## 2018-10-08 MED ORDER — ONDANSETRON HCL 4 MG/2ML IJ SOLN
4.0000 mg | Freq: Once | INTRAMUSCULAR | Status: AC
  Administered 2018-10-08: 4 mg via INTRAVENOUS
  Filled 2018-10-08: qty 2

## 2018-10-08 NOTE — ED Triage Notes (Signed)
From home with c/o of abdominal pain starting around 0300. Pt states he has a hx of diverticulitis and it feels the same.

## 2018-10-08 NOTE — Discharge Instructions (Addendum)
Follow-up with your gastroenterologist.  Take Zofran as needed for nausea.  Continue your Protonix.
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
## 2018-10-08 NOTE — ED Notes (Signed)
Patient transported to CT 

## 2018-10-10 ENCOUNTER — Emergency Department (HOSPITAL_COMMUNITY)
Admission: EM | Admit: 2018-10-10 | Discharge: 2018-10-10 | Disposition: A | Discharge status: Home / Self Care | Payer: Medicare Other | Attending: Emergency Medicine | Admitting: Emergency Medicine

## 2018-10-10 ENCOUNTER — Other Ambulatory Visit: Payer: Self-pay

## 2018-10-10 ENCOUNTER — Encounter (HOSPITAL_COMMUNITY): Payer: Self-pay

## 2018-10-10 DIAGNOSIS — Z79899 Other long term (current) drug therapy: Secondary | ICD-10-CM | POA: Diagnosis not present

## 2018-10-10 DIAGNOSIS — G8929 Other chronic pain: Secondary | ICD-10-CM

## 2018-10-10 DIAGNOSIS — J449 Chronic obstructive pulmonary disease, unspecified: Secondary | ICD-10-CM | POA: Insufficient documentation

## 2018-10-10 DIAGNOSIS — R109 Unspecified abdominal pain: Secondary | ICD-10-CM | POA: Diagnosis not present

## 2018-10-10 DIAGNOSIS — F17210 Nicotine dependence, cigarettes, uncomplicated: Secondary | ICD-10-CM | POA: Diagnosis not present

## 2018-10-10 DIAGNOSIS — R1030 Lower abdominal pain, unspecified: Secondary | ICD-10-CM | POA: Diagnosis not present

## 2018-10-10 LAB — URINALYSIS, ROUTINE W REFLEX MICROSCOPIC
Bilirubin Urine: NEGATIVE
Glucose, UA: NEGATIVE mg/dL
Hgb urine dipstick: NEGATIVE
Ketones, ur: 20 mg/dL — AB
Leukocytes,Ua: NEGATIVE
Nitrite: NEGATIVE
Protein, ur: NEGATIVE mg/dL
Specific Gravity, Urine: 1.014 (ref 1.005–1.030)
pH: 7 (ref 5.0–8.0)

## 2018-10-10 LAB — COMPREHENSIVE METABOLIC PANEL
ALT: 31 U/L (ref 0–44)
AST: 32 U/L (ref 15–41)
Albumin: 3.8 g/dL (ref 3.5–5.0)
Alkaline Phosphatase: 73 U/L (ref 38–126)
Anion gap: 11 (ref 5–15)
BUN: 9 mg/dL (ref 8–23)
CO2: 23 mmol/L (ref 22–32)
Calcium: 8.8 mg/dL — ABNORMAL LOW (ref 8.9–10.3)
Chloride: 97 mmol/L — ABNORMAL LOW (ref 98–111)
Creatinine, Ser: 0.76 mg/dL (ref 0.61–1.24)
GFR calc Af Amer: >60 mL/min (ref >60)
GFR calc non Af Amer: >60 mL/min (ref >60)
Glucose, Bld: 115 mg/dL — ABNORMAL HIGH (ref 70–99)
Potassium: 4.1 mmol/L (ref 3.5–5.1)
Sodium: 131 mmol/L — ABNORMAL LOW (ref 135–145)
Total Bilirubin: 1.1 mg/dL (ref 0.3–1.2)
Total Protein: 7 g/dL (ref 6.5–8.1)

## 2018-10-10 LAB — LIPASE, BLOOD: Lipase: 33 U/L (ref 11–51)

## 2018-10-10 LAB — CBC
HCT: 41.2 % (ref 39.0–52.0)
Hemoglobin: 14.5 g/dL (ref 13.0–17.0)
MCH: 34.7 pg — ABNORMAL HIGH (ref 26.0–34.0)
MCHC: 35.2 g/dL (ref 30.0–36.0)
MCV: 98.6 fL (ref 80.0–100.0)
Platelets: 202 10*3/uL (ref 150–400)
RBC: 4.18 MIL/uL — ABNORMAL LOW (ref 4.22–5.81)
RDW: 11.8 % (ref 11.5–15.5)
WBC: 8.9 10*3/uL (ref 4.0–10.5)
nRBC: 0 % (ref 0.0–0.2)

## 2018-10-10 MED ORDER — LORAZEPAM 2 MG/ML IJ SOLN
0.5000 mg | Freq: Once | INTRAMUSCULAR | Status: AC
  Administered 2018-10-10: 21:00:00 0.5 mg via INTRAVENOUS
  Filled 2018-10-10: qty 1

## 2018-10-10 MED ORDER — SODIUM CHLORIDE 0.9 % IV BOLUS
500.0000 mL | Freq: Once | INTRAVENOUS | Status: AC
  Administered 2018-10-10: 21:00:00 500 mL via INTRAVENOUS

## 2018-10-10 MED ORDER — ONDANSETRON HCL 4 MG/2ML IJ SOLN
4.0000 mg | Freq: Once | INTRAMUSCULAR | Status: AC | PRN
  Administered 2018-10-10: 20:00:00 4 mg via INTRAVENOUS
  Filled 2018-10-10: qty 2

## 2018-10-10 MED ORDER — SODIUM CHLORIDE 0.9% FLUSH: 3.0000 mL | Freq: Once | INTRAVENOUS | Status: DC

## 2018-10-10 MED ORDER — HALOPERIDOL LACTATE 5 MG/ML IJ SOLN
2.0000 mg | Freq: Once | INTRAMUSCULAR | Status: AC
  Administered 2018-10-10: 2 mg via INTRAVENOUS
  Filled 2018-10-10: qty 1
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
## 2018-10-10 NOTE — Discharge Instructions (Addendum)
1.  You must see both your gastroenterologist and your pain management specialist regarding ongoing chronic and recurrent pain. 2.  Continue your home medications as prescribed.  I recommend a stool softener for individuals taking chronic narcotics to avoid constipation.

## 2018-10-10 NOTE — ED Notes (Signed)
PT states understanding of care given, follow up care. PT ambulated from ED to car with a steady gait.  

## 2018-10-25 ENCOUNTER — Emergency Department (HOSPITAL_COMMUNITY)
Admission: EM | Admit: 2018-10-25 | Discharge: 2018-10-25 | Disposition: A | Discharge status: Home / Self Care | Payer: Medicare Other | Attending: Emergency Medicine | Admitting: Emergency Medicine

## 2018-10-25 ENCOUNTER — Emergency Department (HOSPITAL_COMMUNITY): Payer: Medicare Other

## 2018-10-25 ENCOUNTER — Encounter (HOSPITAL_COMMUNITY): Payer: Self-pay | Admitting: Emergency Medicine

## 2018-10-25 ENCOUNTER — Other Ambulatory Visit: Payer: Self-pay

## 2018-10-25 DIAGNOSIS — R109 Unspecified abdominal pain: Secondary | ICD-10-CM

## 2018-10-25 DIAGNOSIS — F17210 Nicotine dependence, cigarettes, uncomplicated: Secondary | ICD-10-CM | POA: Insufficient documentation

## 2018-10-25 DIAGNOSIS — Z79899 Other long term (current) drug therapy: Secondary | ICD-10-CM | POA: Insufficient documentation

## 2018-10-25 DIAGNOSIS — J449 Chronic obstructive pulmonary disease, unspecified: Secondary | ICD-10-CM | POA: Insufficient documentation

## 2018-10-25 DIAGNOSIS — R0602 Shortness of breath: Secondary | ICD-10-CM | POA: Insufficient documentation

## 2018-10-25 DIAGNOSIS — R1084 Generalized abdominal pain: Secondary | ICD-10-CM | POA: Insufficient documentation

## 2018-10-25 DIAGNOSIS — G8929 Other chronic pain: Secondary | ICD-10-CM | POA: Insufficient documentation

## 2018-10-25 DIAGNOSIS — R05 Cough: Secondary | ICD-10-CM | POA: Diagnosis not present

## 2018-10-25 DIAGNOSIS — R112 Nausea with vomiting, unspecified: Secondary | ICD-10-CM | POA: Insufficient documentation

## 2018-10-25 DIAGNOSIS — R509 Fever, unspecified: Secondary | ICD-10-CM | POA: Diagnosis not present

## 2018-10-25 LAB — COMPREHENSIVE METABOLIC PANEL
ALT: 28 U/L (ref 0–44)
AST: 29 U/L (ref 15–41)
Albumin: 3.8 g/dL (ref 3.5–5.0)
Alkaline Phosphatase: 70 U/L (ref 38–126)
Anion gap: 9 (ref 5–15)
BUN: 12 mg/dL (ref 8–23)
CO2: 23 mmol/L (ref 22–32)
Calcium: 9.2 mg/dL (ref 8.9–10.3)
Chloride: 99 mmol/L (ref 98–111)
Creatinine, Ser: 0.77 mg/dL (ref 0.61–1.24)
GFR calc Af Amer: >60 mL/min (ref >60)
GFR calc non Af Amer: >60 mL/min (ref >60)
Glucose, Bld: 133 mg/dL — ABNORMAL HIGH (ref 70–99)
Potassium: 4.1 mmol/L (ref 3.5–5.1)
Sodium: 131 mmol/L — ABNORMAL LOW (ref 135–145)
Total Bilirubin: 0.7 mg/dL (ref 0.3–1.2)
Total Protein: 7.7 g/dL (ref 6.5–8.1)

## 2018-10-25 LAB — CBC WITH DIFFERENTIAL/PLATELET
Abs Immature Granulocytes: 0.03 10*3/uL (ref 0.00–0.07)
Basophils Absolute: 0 10*3/uL (ref 0.0–0.1)
Basophils Relative: 0 %
Eosinophils Absolute: 0 10*3/uL (ref 0.0–0.5)
Eosinophils Relative: 0 %
HCT: 41.7 % (ref 39.0–52.0)
Hemoglobin: 14.2 g/dL (ref 13.0–17.0)
Immature Granulocytes: 0 %
Lymphocytes Relative: 13 %
Lymphs Abs: 1.2 10*3/uL (ref 0.7–4.0)
MCH: 33.6 pg (ref 26.0–34.0)
MCHC: 34.1 g/dL (ref 30.0–36.0)
MCV: 98.8 fL (ref 80.0–100.0)
Monocytes Absolute: 0.4 10*3/uL (ref 0.1–1.0)
Monocytes Relative: 5 %
Neutro Abs: 7.8 10*3/uL — ABNORMAL HIGH (ref 1.7–7.7)
Neutrophils Relative %: 82 %
Platelets: 223 10*3/uL (ref 150–400)
RBC: 4.22 MIL/uL (ref 4.22–5.81)
RDW: 12.6 % (ref 11.5–15.5)
WBC: 9.4 10*3/uL (ref 4.0–10.5)
nRBC: 0 % (ref 0.0–0.2)

## 2018-10-25 LAB — LACTIC ACID, PLASMA: Lactic Acid, Venous: 1.5 mmol/L (ref 0.5–1.9)

## 2018-10-25 LAB — LIPASE, BLOOD: Lipase: 35 U/L (ref 11–51)

## 2018-10-25 MED ORDER — LACTATED RINGERS IV BOLUS
1000.0000 mL | Freq: Once | INTRAVENOUS | Status: AC
  Administered 2018-10-25: 20:00:00 1000 mL via INTRAVENOUS

## 2018-10-25 MED ORDER — PROMETHAZINE HCL 25 MG RE SUPP: 25.0000 mg | Freq: Four times a day (QID) | RECTAL | 0 refills | Status: AC | PRN

## 2018-10-25 MED ORDER — HALOPERIDOL LACTATE 5 MG/ML IJ SOLN
2.0000 mg | Freq: Once | INTRAMUSCULAR | Status: AC
  Administered 2018-10-25: 20:00:00 2 mg via INTRAVENOUS
  Filled 2018-10-25: qty 1

## 2018-10-25 NOTE — ED Triage Notes (Signed)
C/o upper back pain, SOB, and productive cough with clear phlegm since this morning.
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
## 2020-09-28 ENCOUNTER — Encounter: Payer: Self-pay | Admitting: Gastroenterology

## 2021-04-06 ENCOUNTER — Emergency Department (HOSPITAL_COMMUNITY): Payer: Medicare (Managed Care)

## 2021-04-06 ENCOUNTER — Emergency Department (HOSPITAL_COMMUNITY)
Admission: EM | Admit: 2021-04-06 | Discharge: 2021-04-06 | Disposition: A | Discharge status: Home / Self Care | Payer: Medicare (Managed Care) | Attending: Emergency Medicine | Admitting: Emergency Medicine

## 2021-04-06 DIAGNOSIS — T402X1A Poisoning by other opioids, accidental (unintentional), initial encounter: Secondary | ICD-10-CM | POA: Diagnosis not present

## 2021-04-06 DIAGNOSIS — S0990XA Unspecified injury of head, initial encounter: Secondary | ICD-10-CM | POA: Diagnosis present

## 2021-04-06 DIAGNOSIS — F17210 Nicotine dependence, cigarettes, uncomplicated: Secondary | ICD-10-CM | POA: Insufficient documentation

## 2021-04-06 DIAGNOSIS — W01198A Fall on same level from slipping, tripping and stumbling with subsequent striking against other object, initial encounter: Secondary | ICD-10-CM | POA: Diagnosis not present

## 2021-04-06 DIAGNOSIS — W19XXXA Unspecified fall, initial encounter: Secondary | ICD-10-CM

## 2021-04-06 DIAGNOSIS — S0083XA Contusion of other part of head, initial encounter: Secondary | ICD-10-CM | POA: Insufficient documentation

## 2021-04-06 DIAGNOSIS — J45909 Unspecified asthma, uncomplicated: Secondary | ICD-10-CM | POA: Diagnosis not present

## 2021-04-06 DIAGNOSIS — J449 Chronic obstructive pulmonary disease, unspecified: Secondary | ICD-10-CM | POA: Insufficient documentation

## 2021-04-06 DIAGNOSIS — T40601A Poisoning by unspecified narcotics, accidental (unintentional), initial encounter: Secondary | ICD-10-CM

## 2021-04-06 LAB — RAPID URINE DRUG SCREEN, HOSP PERFORMED
Amphetamines: NOT DETECTED
Barbiturates: NOT DETECTED
Benzodiazepines: NOT DETECTED
Cocaine: NOT DETECTED
Opiates: NOT DETECTED
Tetrahydrocannabinol: NOT DETECTED

## 2021-04-06 LAB — CBC WITH DIFFERENTIAL/PLATELET
Abs Immature Granulocytes: 0.05 10*3/uL (ref 0.00–0.07)
Basophils Absolute: 0.1 10*3/uL (ref 0.0–0.1)
Basophils Relative: 1 %
Eosinophils Absolute: 0.1 10*3/uL (ref 0.0–0.5)
Eosinophils Relative: 1 %
HCT: 42 % (ref 39.0–52.0)
Hemoglobin: 14.5 g/dL (ref 13.0–17.0)
Immature Granulocytes: 1 %
Lymphocytes Relative: 14 %
Lymphs Abs: 1.3 10*3/uL (ref 0.7–4.0)
MCH: 34.4 pg — ABNORMAL HIGH (ref 26.0–34.0)
MCHC: 34.5 g/dL (ref 30.0–36.0)
MCV: 99.5 fL (ref 80.0–100.0)
Monocytes Absolute: 0.6 10*3/uL (ref 0.1–1.0)
Monocytes Relative: 6 %
Neutro Abs: 7.3 10*3/uL (ref 1.7–7.7)
Neutrophils Relative %: 77 %
Platelets: 205 10*3/uL (ref 150–400)
RBC: 4.22 MIL/uL (ref 4.22–5.81)
RDW: 13.5 % (ref 11.5–15.5)
WBC: 9.4 10*3/uL (ref 4.0–10.5)
nRBC: 0 % (ref 0.0–0.2)

## 2021-04-06 LAB — URINALYSIS, ROUTINE W REFLEX MICROSCOPIC
Bacteria, UA: NONE SEEN
Bilirubin Urine: NEGATIVE
Glucose, UA: NEGATIVE mg/dL
Hgb urine dipstick: NEGATIVE
Ketones, ur: NEGATIVE mg/dL
Leukocytes,Ua: NEGATIVE
Nitrite: NEGATIVE
Protein, ur: 30 mg/dL — AB
Specific Gravity, Urine: 1.015 (ref 1.005–1.030)
pH: 5 (ref 5.0–8.0)

## 2021-04-06 LAB — COMPREHENSIVE METABOLIC PANEL
ALT: 15 U/L (ref 0–44)
AST: 24 U/L (ref 15–41)
Albumin: 3.9 g/dL (ref 3.5–5.0)
Alkaline Phosphatase: 69 U/L (ref 38–126)
Anion gap: 11 (ref 5–15)
BUN: 12 mg/dL (ref 8–23)
CO2: 21 mmol/L — ABNORMAL LOW (ref 22–32)
Calcium: 8.9 mg/dL (ref 8.9–10.3)
Chloride: 104 mmol/L (ref 98–111)
Creatinine, Ser: 1.45 mg/dL — ABNORMAL HIGH (ref 0.61–1.24)
GFR, Estimated: 52 mL/min — ABNORMAL LOW (ref >60)
Glucose, Bld: 195 mg/dL — ABNORMAL HIGH (ref 70–99)
Potassium: 4.2 mmol/L (ref 3.5–5.1)
Sodium: 136 mmol/L (ref 135–145)
Total Bilirubin: 0.7 mg/dL (ref 0.3–1.2)
Total Protein: 7.5 g/dL (ref 6.5–8.1)

## 2021-04-06 LAB — CBG MONITORING, ED: Glucose-Capillary: 196 mg/dL — ABNORMAL HIGH (ref 70–99)

## 2021-04-06 LAB — ETHANOL: Alcohol, Ethyl (B): <10 mg/dL (ref <10)

## 2021-04-06 LAB — SALICYLATE LEVEL: Salicylate Lvl: <7 mg/dL — ABNORMAL LOW (ref 7.0–30.0)

## 2021-04-06 LAB — ACETAMINOPHEN LEVEL: Acetaminophen (Tylenol), Serum: <10 ug/mL — ABNORMAL LOW (ref 10–30)

## 2021-04-06 MED ORDER — SODIUM CHLORIDE 0.9 % IV SOLN: INTRAVENOUS | Status: DC

## 2021-04-06 MED ORDER — SODIUM CHLORIDE 0.9 % IV BOLUS
1000.0000 mL | Freq: Once | INTRAVENOUS | Status: AC
  Administered 2021-04-06: 1000 mL via INTRAVENOUS

## 2021-04-06 MED ORDER — NALOXONE HCL 2 MG/2ML IJ SOSY: 1.0000 mg | PREFILLED_SYRINGE | INTRAMUSCULAR | Status: DC | PRN

## 2021-04-06 NOTE — Discharge Instructions (Addendum)
Only take narcotics that have been prescribed to you.
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
## 2021-04-06 NOTE — ED Notes (Signed)
Patient transported to CT 
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
## 2021-04-06 NOTE — ED Notes (Signed)
Pt ambulatory without assistance, O2 remained 94-96%. Pt denied lightheadedness, NAD noted.
